# Patient Record
Sex: Female | Born: 1955 | Race: White | Hispanic: No | Marital: Married | Smoking: Former smoker
Health system: Southern US, Community
[De-identification: ages and names within clinical notes are randomized; demographics above are authoritative.]

## PROBLEM LIST (undated history)

## (undated) DIAGNOSIS — Z9889 Other specified postprocedural states: Secondary | ICD-10-CM

## (undated) DIAGNOSIS — K439 Ventral hernia without obstruction or gangrene: Secondary | ICD-10-CM

## (undated) DIAGNOSIS — M1990 Unspecified osteoarthritis, unspecified site: Secondary | ICD-10-CM

## (undated) DIAGNOSIS — E119 Type 2 diabetes mellitus without complications: Secondary | ICD-10-CM

## (undated) DIAGNOSIS — Z87442 Personal history of urinary calculi: Secondary | ICD-10-CM

## (undated) DIAGNOSIS — R112 Nausea with vomiting, unspecified: Secondary | ICD-10-CM

## (undated) DIAGNOSIS — E785 Hyperlipidemia, unspecified: Secondary | ICD-10-CM

## (undated) DIAGNOSIS — I10 Essential (primary) hypertension: Secondary | ICD-10-CM

## (undated) DIAGNOSIS — K219 Gastro-esophageal reflux disease without esophagitis: Secondary | ICD-10-CM

## (undated) DIAGNOSIS — C801 Malignant (primary) neoplasm, unspecified: Secondary | ICD-10-CM

## (undated) DIAGNOSIS — J45909 Unspecified asthma, uncomplicated: Secondary | ICD-10-CM

## (undated) HISTORY — DX: Unspecified osteoarthritis, unspecified site: M19.90

## (undated) HISTORY — DX: Type 2 diabetes mellitus without complications: E11.9

## (undated) HISTORY — PX: BREAST BIOPSY: SHX20

## (undated) HISTORY — DX: Unspecified asthma, uncomplicated: J45.909

## (undated) HISTORY — DX: Hyperlipidemia, unspecified: E78.5

## (undated) HISTORY — PX: CATARACT EXTRACTION: SUR2

## (undated) HISTORY — DX: Gastro-esophageal reflux disease without esophagitis: K21.9

## (undated) HISTORY — DX: Essential (primary) hypertension: I10

---
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
## 1989-04-04 HISTORY — PX: CHOLECYSTECTOMY: SHX55

## 1998-04-04 HISTORY — PX: OTHER SURGICAL HISTORY: SHX169

## 1999-04-05 HISTORY — PX: ABDOMINAL HYSTERECTOMY: SHX81

## 2000-04-04 HISTORY — PX: HERNIA REPAIR: SHX51

## 2012-06-08 ENCOUNTER — Encounter (INDEPENDENT_AMBULATORY_CARE_PROVIDER_SITE_OTHER): Payer: BC Managed Care – PPO | Admitting: Surgery

## 2012-06-08 ENCOUNTER — Telehealth (INDEPENDENT_AMBULATORY_CARE_PROVIDER_SITE_OTHER): Payer: Self-pay | Admitting: General Surgery

## 2012-06-08 NOTE — Telephone Encounter (Signed)
Pt was scheduled from [facility] for urgent office today.  Discussed her situation with Dr. Gerrit Friends and pt was reappointed to Monday urgent office.  She is currently on oral antibiotics (Keflex) and received Rocephin injection at Dr. Bonnita Levan office.  She understands to go to ER if temperature is elevated or pain worsens; she will come to either [facility] or [facility] in that instance.

## 2012-06-11 ENCOUNTER — Encounter (INDEPENDENT_AMBULATORY_CARE_PROVIDER_SITE_OTHER): Payer: Self-pay | Admitting: General Surgery

## 2012-06-11 ENCOUNTER — Ambulatory Visit (INDEPENDENT_AMBULATORY_CARE_PROVIDER_SITE_OTHER): Payer: BC Managed Care – PPO | Admitting: General Surgery

## 2012-06-11 ENCOUNTER — Telehealth (INDEPENDENT_AMBULATORY_CARE_PROVIDER_SITE_OTHER): Payer: Self-pay | Admitting: General Surgery

## 2012-06-11 ENCOUNTER — Other Ambulatory Visit (INDEPENDENT_AMBULATORY_CARE_PROVIDER_SITE_OTHER): Payer: Self-pay | Admitting: General Surgery

## 2012-06-11 VITALS — BP 158/90 | HR 100 | Temp 96.9°F | Ht 63.0 in | Wt 235.0 lb

## 2012-06-11 DIAGNOSIS — L03311 Cellulitis of abdominal wall: Secondary | ICD-10-CM

## 2012-06-11 DIAGNOSIS — L02219 Cutaneous abscess of trunk, unspecified: Secondary | ICD-10-CM

## 2012-06-11 DIAGNOSIS — L03319 Cellulitis of trunk, unspecified: Secondary | ICD-10-CM

## 2012-06-11 DIAGNOSIS — L02211 Cutaneous abscess of abdominal wall: Secondary | ICD-10-CM

## 2012-06-11 NOTE — Progress Notes (Signed)
The patient comes in with a one-week history of abdominal wall cellulitis and an indurated area just to the left and inferior of her umbilicus. She is a non-insulin-dependent diabetic she is morbidly obese but this problem seems to have improved after she received 2 injections of Rocephin. She continues to take Keflex in addition to having gotten injections of Rocephin.  On examination she has an indurated area in the left inferior periumbilical area that measures probably 5-6 cm in size. It is tender to touch. The amount of erythema has decreased significantly according to the patient and her husband.  I cannot palpate any actual fluctuance however there is a good chance that somewhat related to her prior hernia repair which was done with mesh that she might have an infected area and maybe some purulence and it may be infected the mesh  Because of these concerns would like to get a CT scan of the abdomen and pelvis with contrast to see if there is actually fluid collection in that area if so then she will likely need an incision and drainage under anesthesia because of the depth of the problem. We will go ahead and order CT scan of abdomen and pelvis with contrast for the patient to be done likely tomorrow morning in the area where she lives which is in the [place] area of [place].

## 2012-06-11 NOTE — Telephone Encounter (Signed)
Patient called and stated that she is going to [facility] to get her lab work done. She went across the street to [facility] and she stated that she waited an hour, so wanted to let you know that she is going to [facility]

## 2012-06-12 ENCOUNTER — Ambulatory Visit (HOSPITAL_COMMUNITY)
Admission: RE | Admit: 2012-06-12 | Discharge: 2012-06-12 | Disposition: A | Discharge status: Home / Self Care | Payer: BC Managed Care – PPO | Source: Ambulatory Visit | Attending: General Surgery | Admitting: General Surgery

## 2012-06-12 ENCOUNTER — Telehealth (INDEPENDENT_AMBULATORY_CARE_PROVIDER_SITE_OTHER): Payer: Self-pay

## 2012-06-12 DIAGNOSIS — K439 Ventral hernia without obstruction or gangrene: Secondary | ICD-10-CM | POA: Insufficient documentation

## 2012-06-12 DIAGNOSIS — L02219 Cutaneous abscess of trunk, unspecified: Secondary | ICD-10-CM | POA: Insufficient documentation

## 2012-06-12 LAB — COMPREHENSIVE METABOLIC PANEL
Albumin: 3.8 g/dL (ref 3.5–5.2)
Alkaline Phosphatase: 89 U/L (ref 39–117)
BUN: 16 mg/dL (ref 6–23)
Calcium: 10.4 mg/dL (ref 8.4–10.5)
Glucose, Bld: 107 mg/dL — ABNORMAL HIGH (ref 70–99)
Potassium: 5.3 mEq/L (ref 3.5–5.3)

## 2012-06-12 MED ORDER — IOHEXOL 300 MG/ML  SOLN
100.0000 mL | Freq: Once | INTRAMUSCULAR | Status: AC | PRN
  Administered 2012-06-12: 100 mL via INTRAVENOUS

## 2012-06-12 NOTE — Telephone Encounter (Signed)
I called patient to let her know Lindie Spruce is moving her appt up to 3/18 @ 10:10. SHe needs to stay on her current abx.

## 2012-06-12 NOTE — Telephone Encounter (Signed)
Patient called back and stated she may be out of her antibiotic prior to her appt on 06/19/2012.  Spoke to [place] who had me instruct patient to call the day before she runs out of antibiotic to see if Lindie Spruce MD will want to call in prescription.  Patient states understanding and agreeable at this time.

## 2012-06-14 ENCOUNTER — Telehealth (INDEPENDENT_AMBULATORY_CARE_PROVIDER_SITE_OTHER): Payer: Self-pay | Admitting: General Surgery

## 2012-06-14 NOTE — Telephone Encounter (Signed)
Pt called to ask about CT result and new pocket of fluid "right under the skin."  She has appt next Tues to follow up with Dr. Lindie Spruce.  Paged and updated Dr. Lindie Spruce; feels its OK to wait until next week unless pain is worse.  She understands and will take ibuprofen for discomfort.

## 2012-06-19 ENCOUNTER — Ambulatory Visit (INDEPENDENT_AMBULATORY_CARE_PROVIDER_SITE_OTHER): Payer: BC Managed Care – PPO | Admitting: General Surgery

## 2012-06-19 ENCOUNTER — Encounter (INDEPENDENT_AMBULATORY_CARE_PROVIDER_SITE_OTHER): Payer: Self-pay | Admitting: General Surgery

## 2012-06-19 VITALS — BP 132/84 | HR 78 | Temp 98.6°F | Resp 18 | Ht 63.0 in | Wt 234.0 lb

## 2012-06-19 DIAGNOSIS — K439 Ventral hernia without obstruction or gangrene: Secondary | ICD-10-CM

## 2012-06-19 MED ORDER — AMOXICILLIN-POT CLAVULANATE 875-125 MG PO TABS: 1.0000 | ORAL_TABLET | Freq: Two times a day (BID) | ORAL | Status: AC

## 2012-06-19 MED ORDER — TRAMADOL HCL 50 MG PO TABS: 50.0000 mg | ORAL_TABLET | Freq: Four times a day (QID) | ORAL | Status: DC | PRN

## 2012-06-19 NOTE — Progress Notes (Signed)
The patient comes in today with continued discomfort in her periumbilical area. She has significant induration and fullness in that area. After Betadine prep and local anesthesia was able to aspirate approximately 5 cc of bloody pus from her periumbilical area. No enteric contents were noted. The patient did have some moderate discomfort afterwards.  In addition to this fluid collection the patient has a large ventral hernia. Prior to doing anything about her hernia she needs to have this infection control. This does require an incision and drainage under general anesthesia in the operating room.  In the future hernia repair it is possible however because of her weight it will be a significant challenge. We will go ahead and schedule her for an incision and drainage. Prior to doing so I will place the patient on Augmentin therapy today twice a day. Her wound drainage aspiration will be sent for culture.

## 2012-06-19 NOTE — Addendum Note (Signed)
Addended by: Frederik Schmidt on: 06/19/2012 11:11 AM   Modules accepted: Orders

## 2012-06-20 ENCOUNTER — Encounter (HOSPITAL_COMMUNITY): Payer: Self-pay | Admitting: Pharmacy Technician

## 2012-06-22 LAB — WOUND CULTURE: Organism ID, Bacteria: NO GROWTH

## 2012-06-26 ENCOUNTER — Encounter (INDEPENDENT_AMBULATORY_CARE_PROVIDER_SITE_OTHER): Payer: BC Managed Care – PPO | Admitting: General Surgery

## 2012-06-26 NOTE — Pre-Procedure Instructions (Signed)
Leomia Stahle  06/26/2012   Your procedure is scheduled on:  Monday, March 31st  Report to [facility] at 0800 AM.  Call this number if you have problems the morning of surgery: [phone]   Remember:   Do not eat food or drink liquids after midnight.    Take these medicines the morning of surgery with A SIP OF WATER: Augmentin, zyrtec, toprol, prilosec, ultram if needed   Do not wear jewelry, make-up or nail polish.  Do not wear lotions, powders, or perfumes,deodorant.  Do not shave 48 hours prior to surgery.  Do not bring valuables to the hospital.  Contacts, dentures or bridgework may not be worn into surgery.  Leave suitcase in the car. After surgery it may be brought to your room.  For patients admitted to the hospital, checkout time is 11:00 AM the day of discharge.   Patients discharged the day of surgery will not be allowed to drive home.    Special Instructions: Shower using CHG 2 nights before surgery and the night before surgery.  If you shower the day of surgery use CHG.  Use special wash - you have one bottle of CHG for all showers.  You should use approximately 1/3 of the bottle for each shower.   Please read over the following fact sheets that you were given: Pain Booklet, Coughing and Deep Breathing, MRSA Information and Surgical Site Infection Prevention

## 2012-06-27 ENCOUNTER — Encounter (HOSPITAL_COMMUNITY)
Admission: RE | Admit: 2012-06-27 | Discharge: 2012-06-27 | Disposition: A | Discharge status: Home / Self Care | Payer: BC Managed Care – PPO | Source: Ambulatory Visit | Attending: General Surgery | Admitting: General Surgery

## 2012-06-27 ENCOUNTER — Encounter (HOSPITAL_COMMUNITY): Payer: Self-pay

## 2012-06-27 HISTORY — DX: Nausea with vomiting, unspecified: R11.2

## 2012-06-27 HISTORY — DX: Nausea with vomiting, unspecified: Z98.890

## 2012-06-27 LAB — CBC WITH DIFFERENTIAL/PLATELET
Hemoglobin: 13.2 g/dL (ref 12.0–15.0)
Lymphocytes Relative: 31 % (ref 12–46)
Lymphs Abs: 3.3 10*3/uL (ref 0.7–4.0)
MCH: 30.6 pg (ref 26.0–34.0)
MCV: 91.4 fL (ref 78.0–100.0)
Monocytes Relative: 5 % (ref 3–12)
Neutrophils Relative %: 60 % (ref 43–77)
Platelets: 363 10*3/uL (ref 150–400)
RBC: 4.31 MIL/uL (ref 3.87–5.11)
WBC: 10.8 10*3/uL — ABNORMAL HIGH (ref 4.0–10.5)

## 2012-06-27 LAB — BASIC METABOLIC PANEL
CO2: 27 mEq/L (ref 19–32)
Calcium: 9.9 mg/dL (ref 8.4–10.5)
GFR calc non Af Amer: >90 mL/min (ref >90)
Glucose, Bld: 183 mg/dL — ABNORMAL HIGH (ref 70–99)
Potassium: 4.4 mEq/L (ref 3.5–5.1)
Sodium: 140 mEq/L (ref 135–145)

## 2012-06-27 LAB — SURGICAL PCR SCREEN
MRSA, PCR: NEGATIVE
Staphylococcus aureus: NEGATIVE

## 2012-06-27 NOTE — Progress Notes (Addendum)
Patient is at risk for COSA; Questionnaire faxed to PCP, Dr Sherril Croon in [place], [place]

## 2012-07-01 NOTE — H&P (Signed)
Leslie [name] is an 57 y.o. female.   Chief Complaint: Abdominal pain and recurrent hernia  HPI: The patient was initially referred from [facility] with a periumbilical erythematous area with induration on March 10th.  She had received an IM dose of Rocephin and had been placed on oral antibiotics.  When I saw her in my office I sent her for a CT scan which demonstrated that the patient had a recurrent ventral hernia and a peri-prosthetic fluid collection suspicious for an abscess.  On her next office visit approximately 5 cc of purulent bloody fluid was aspirated from the periumbilical area which failed to grow out any bacteria.  She continues to have a build up of fluid around the site of her previous hernia repair.  She is being brought in for surgical drainage.  Repair of her hernia is not planned.  Past Medical History  Diagnosis Date  . Hyperlipidemia   . Asthma   . GERD (gastroesophageal reflux disease)   . Arthritis   . PONV (postoperative nausea and vomiting)   . Hypertension     on meds for 2 years  . Diabetes mellitus without complication     Type 2 NIDDM x 3 years    Past Surgical History  Procedure Laterality Date  . Hernia repair  2002  . Cholecystectomy  1991  . Tonsillectomy  1967  . Abdominal hysterectomy  2001  . Kidney stones  2000    Family History  Problem Relation Age of Onset  . Hypertension Mother   . Alzheimer's disease Mother    Social History:  reports that she has quit smoking. Her smoking use included Cigarettes. She has a 4 pack-year smoking history. She does not have any smokeless tobacco history on file. She reports that  drinks alcohol. She reports that she does not use illicit drugs.  Allergies:  Allergies  Allergen Reactions  . Tape     BAND AIDS  . Hydrochlorothiazide Other (See Comments)    High sugars  . Lisinopril Cough  . Morphine And Related Nausea And Vomiting    No prescriptions prior to admission    No results found for this or  any previous visit (from the past 48 hour(s)). No results found.  ROS  There were no vitals taken for this visit. Physical Exam  Constitutional: She is oriented to person, place, and time. She appears well-developed and well-nourished.  HENT:  Head: Normocephalic and atraumatic.  Cardiovascular: Normal rate and regular rhythm.   Respiratory: Effort normal and breath sounds normal.  GI: Soft. Bowel sounds are normal.    Musculoskeletal: Normal range of motion.  Neurological: She is alert and oriented to person, place, and time. She has normal reflexes.  Skin: Skin is warm and dry.  Psychiatric: She has a normal mood and affect. Her behavior is normal. Judgment and thought content normal.     Assessment/Plan Obese diabetic patient with recurrent hernia and peri-prosthetic fluid collection with possible infection.  The current infection has to be cleared prior to surgical consideration of repair, at least with synthetic material..  She is being taken to surgery for drainage and washout.  Cherylynn Ridges 07/01/2012, 10:01 PM Patient has no further questions.  Not likely to repair hernia today.  Marta Lamas. Gae Bon, MD, FACS [phone] [facility]

## 2012-07-02 ENCOUNTER — Ambulatory Visit (HOSPITAL_COMMUNITY): Payer: BC Managed Care – PPO | Admitting: Anesthesiology

## 2012-07-02 ENCOUNTER — Ambulatory Visit (HOSPITAL_COMMUNITY)
Admission: RE | Admit: 2012-07-02 | Discharge: 2012-07-02 | Disposition: A | Discharge status: Home / Self Care | Payer: BC Managed Care – PPO | Source: Ambulatory Visit | Attending: General Surgery | Admitting: General Surgery

## 2012-07-02 ENCOUNTER — Other Ambulatory Visit (INDEPENDENT_AMBULATORY_CARE_PROVIDER_SITE_OTHER): Payer: Self-pay

## 2012-07-02 ENCOUNTER — Encounter (HOSPITAL_COMMUNITY): Payer: Self-pay | Admitting: *Deleted

## 2012-07-02 ENCOUNTER — Encounter (HOSPITAL_COMMUNITY): Payer: Self-pay | Admitting: Anesthesiology

## 2012-07-02 ENCOUNTER — Ambulatory Visit (HOSPITAL_COMMUNITY): Payer: BC Managed Care – PPO

## 2012-07-02 ENCOUNTER — Encounter (HOSPITAL_COMMUNITY)
Admission: RE | Disposition: A | Discharge status: Home / Self Care | Payer: Self-pay | Source: Ambulatory Visit | Attending: General Surgery

## 2012-07-02 DIAGNOSIS — Z01810 Encounter for preprocedural cardiovascular examination: Secondary | ICD-10-CM | POA: Insufficient documentation

## 2012-07-02 DIAGNOSIS — Z01812 Encounter for preprocedural laboratory examination: Secondary | ICD-10-CM | POA: Insufficient documentation

## 2012-07-02 DIAGNOSIS — K432 Incisional hernia without obstruction or gangrene: Secondary | ICD-10-CM | POA: Insufficient documentation

## 2012-07-02 DIAGNOSIS — L03319 Cellulitis of trunk, unspecified: Secondary | ICD-10-CM

## 2012-07-02 DIAGNOSIS — L02211 Cutaneous abscess of abdominal wall: Secondary | ICD-10-CM

## 2012-07-02 DIAGNOSIS — I10 Essential (primary) hypertension: Secondary | ICD-10-CM | POA: Insufficient documentation

## 2012-07-02 DIAGNOSIS — Z01818 Encounter for other preprocedural examination: Secondary | ICD-10-CM | POA: Insufficient documentation

## 2012-07-02 DIAGNOSIS — L02219 Cutaneous abscess of trunk, unspecified: Secondary | ICD-10-CM

## 2012-07-02 DIAGNOSIS — E119 Type 2 diabetes mellitus without complications: Secondary | ICD-10-CM | POA: Insufficient documentation

## 2012-07-02 HISTORY — PX: INCISION AND DRAINAGE ABSCESS: SHX5864

## 2012-07-02 LAB — GLUCOSE, CAPILLARY: Glucose-Capillary: 119 mg/dL — ABNORMAL HIGH (ref 70–99)

## 2012-07-02 SURGERY — INCISION AND DRAINAGE, ABSCESS
Anesthesia: General | Site: Abdomen | Wound class: Dirty or Infected

## 2012-07-02 MED ORDER — NEOSTIGMINE METHYLSULFATE 1 MG/ML IJ SOLN
INTRAMUSCULAR | Status: DC | PRN
  Administered 2012-07-02: 5 mg via INTRAVENOUS

## 2012-07-02 MED ORDER — MIDAZOLAM HCL 2 MG/2ML IJ SOLN: 2.0000 mg | Freq: Once | INTRAMUSCULAR | Status: DC

## 2012-07-02 MED ORDER — LACTATED RINGERS IV SOLN
INTRAVENOUS | Status: DC | PRN
  Administered 2012-07-02: 10:00:00 via INTRAVENOUS

## 2012-07-02 MED ORDER — PROPOFOL 10 MG/ML IV BOLUS
INTRAVENOUS | Status: DC | PRN
  Administered 2012-07-02: 160 mg via INTRAVENOUS

## 2012-07-02 MED ORDER — LIDOCAINE HCL (CARDIAC) 20 MG/ML IV SOLN
INTRAVENOUS | Status: DC | PRN
  Administered 2012-07-02: 80 mg via INTRAVENOUS

## 2012-07-02 MED ORDER — 0.9 % SODIUM CHLORIDE (POUR BTL) OPTIME
TOPICAL | Status: DC | PRN
  Administered 2012-07-02: 1000 mL

## 2012-07-02 MED ORDER — ROCURONIUM BROMIDE 100 MG/10ML IV SOLN
INTRAVENOUS | Status: DC | PRN
  Administered 2012-07-02: 40 mg via INTRAVENOUS

## 2012-07-02 MED ORDER — HYDROCODONE-ACETAMINOPHEN 5-500 MG PO TABS: 1.0000 | ORAL_TABLET | Freq: Four times a day (QID) | ORAL | Status: DC | PRN

## 2012-07-02 MED ORDER — DEXTROSE 5 % IV SOLN
3.0000 g | INTRAVENOUS | Status: AC
  Administered 2012-07-02: 3 g via INTRAVENOUS
  Filled 2012-07-02: qty 3000

## 2012-07-02 MED ORDER — ALBUTEROL SULFATE HFA 108 (90 BASE) MCG/ACT IN AERS
INHALATION_SPRAY | RESPIRATORY_TRACT | Status: DC | PRN
  Administered 2012-07-02: 6 via RESPIRATORY_TRACT

## 2012-07-02 MED ORDER — GLYCOPYRROLATE 0.2 MG/ML IJ SOLN
INTRAMUSCULAR | Status: DC | PRN
  Administered 2012-07-02: .8 mg via INTRAVENOUS

## 2012-07-02 MED ORDER — FENTANYL CITRATE 0.05 MG/ML IJ SOLN
INTRAMUSCULAR | Status: DC | PRN
  Administered 2012-07-02: 100 ug via INTRAVENOUS
  Administered 2012-07-02: 50 ug via INTRAVENOUS

## 2012-07-02 MED ORDER — METOCLOPRAMIDE HCL 5 MG/ML IJ SOLN
10.0000 mg | Freq: Once | INTRAMUSCULAR | Status: AC
  Administered 2012-07-02: 10 mg via INTRAVENOUS

## 2012-07-02 MED ORDER — FENTANYL CITRATE 0.05 MG/ML IJ SOLN
25.0000 ug | INTRAMUSCULAR | Status: DC | PRN
  Administered 2012-07-02 (×2): 50 ug via INTRAVENOUS

## 2012-07-02 MED ORDER — MIDAZOLAM HCL 2 MG/2ML IJ SOLN
INTRAMUSCULAR | Status: AC
  Administered 2012-07-02: 2 mg
  Filled 2012-07-02: qty 2

## 2012-07-02 MED ORDER — ONDANSETRON HCL 4 MG/2ML IJ SOLN
INTRAMUSCULAR | Status: DC | PRN
  Administered 2012-07-02: 4 mg via INTRAVENOUS

## 2012-07-02 MED ORDER — FENTANYL CITRATE 0.05 MG/ML IJ SOLN
INTRAMUSCULAR | Status: AC
  Filled 2012-07-02: qty 2

## 2012-07-02 MED ORDER — LACTATED RINGERS IV SOLN: INTRAVENOUS | Status: DC

## 2012-07-02 SURGICAL SUPPLY — 26 items
BANDAGE GAUZE ELAST BULKY 4 IN (GAUZE/BANDAGES/DRESSINGS) IMPLANT
CANISTER SUCTION 2500CC (MISCELLANEOUS) ×2 IMPLANT
CLOTH BEACON ORANGE TIMEOUT ST (SAFETY) ×2 IMPLANT
COVER SURGICAL LIGHT HANDLE (MISCELLANEOUS) ×2 IMPLANT
DRAPE LAPAROSCOPIC ABDOMINAL (DRAPES) ×2 IMPLANT
DRAPE UTILITY 15X26 W/TAPE STR (DRAPE) ×4 IMPLANT
DRSG PAD ABDOMINAL 8X10 ST (GAUZE/BANDAGES/DRESSINGS) IMPLANT
ELECT CAUTERY BLADE 6.4 (BLADE) ×2 IMPLANT
ELECT REM PT RETURN 9FT ADLT (ELECTROSURGICAL) ×2
ELECTRODE REM PT RTRN 9FT ADLT (ELECTROSURGICAL) ×1 IMPLANT
GAUZE PACKING IODOFORM 1/2 (PACKING) ×2 IMPLANT
GLOVE BIOGEL PI IND STRL 7.0 (GLOVE) ×2 IMPLANT
GLOVE BIOGEL PI INDICATOR 7.0 (GLOVE) ×2
GLOVE SURG SS PI 7.0 STRL IVOR (GLOVE) ×4 IMPLANT
GOWN STRL NON-REIN LRG LVL3 (GOWN DISPOSABLE) ×4 IMPLANT
KIT BASIN OR (CUSTOM PROCEDURE TRAY) ×2 IMPLANT
KIT ROOM TURNOVER OR (KITS) ×2 IMPLANT
NS IRRIG 1000ML POUR BTL (IV SOLUTION) ×2 IMPLANT
PACK GENERAL/GYN (CUSTOM PROCEDURE TRAY) ×2 IMPLANT
PAD ARMBOARD 7.5X6 YLW CONV (MISCELLANEOUS) ×2 IMPLANT
SPONGE GAUZE 4X4 12PLY (GAUZE/BANDAGES/DRESSINGS) IMPLANT
SWAB COLLECTION DEVICE MRSA (MISCELLANEOUS) ×2 IMPLANT
TAPE CLOTH SURG 6X10 WHT LF (GAUZE/BANDAGES/DRESSINGS) ×2 IMPLANT
TOWEL OR 17X24 6PK STRL BLUE (TOWEL DISPOSABLE) ×2 IMPLANT
TOWEL OR 17X26 10 PK STRL BLUE (TOWEL DISPOSABLE) ×2 IMPLANT
TUBE ANAEROBIC SPECIMEN COL (MISCELLANEOUS) ×2 IMPLANT

## 2012-07-02 NOTE — Op Note (Signed)
OPERATIVE REPORT  DATE OF OPERATION: 07/02/2012  PATIENT:  Leslie [name]  57 y.o. female  PRE-OPERATIVE DIAGNOSIS:  Central abdominal wall abscess  POST-OPERATIVE DIAGNOSIS:  Central abdominal wall abscess  PROCEDURE:  Procedure(s): INCISION AND DRAINAGE ABSCESS of abdominal wall abscess  SURGEON:  Surgeon(s): Cherylynn Ridges, MD  ASSISTANT: None  ANESTHESIA:   general  EBL: <20 ml  BLOOD ADMINISTERED: none  DRAINS: Wound packed with entire bottle of 1/2 inch Iodoform NuGauze   SPECIMEN:  Source of Specimen:  Abdominal wall abscess for aerobic and anaerobic cultures  COUNTS CORRECT:  YES  PROCEDURE DETAILS: The patient was taken to the operating room and placed on the table in the supine position. After an adequate general endotracheal anesthetic was administered she was prepped and draped in the usual sterile manner exposing her entire abdomen.  After proper time out was performed identifying the patient and the procedure to be performed an upper midline incision just to the left of the umbilicus was made using a #10 blade and taken down to subcutaneous tissue. In the deep portions just to the left of the umbilicus in the area of most induration there was a small pocket of cloudy fluid. Those mostly and evacuated area with what palpated to be a possible hernia sac. There was no protruding intestines. This 3 cm incision was used to pack the entire bottle of half-inch iodoform Nu Gauze into the pocket with the patient had previously had an infection. It was covered with a sterile dressing.  All counts were correct.  PATIENT DISPOSITION:  PACU - hemodynamically stable.   Cherylynn Ridges 3/31/20141:41 PM

## 2012-07-02 NOTE — Preoperative (Signed)
2Beta Blockers   Reason not to administer Beta Blockers:Metorprolol 07/02/12 0530

## 2012-07-02 NOTE — Progress Notes (Signed)
Dr.Edwards at bedside called chest xray to dr. Randa Evens shows possible pneumonia or aspiration

## 2012-07-02 NOTE — Anesthesia Procedure Notes (Addendum)
Performed by: Leona Singleton A   Date/Time: 07/02/2012 1:18 PM Performed by: Leona Singleton A Pre-anesthesia Checklist: Patient identified Patient Re-evaluated:Patient Re-evaluated prior to inductionOxygen Delivery Method: Circle system utilized Preoxygenation: Pre-oxygenation with 100% oxygen Intubation Type: IV induction Ventilation: Mask ventilation without difficulty Laryngoscope Size: Miller and 2 Grade View: Grade I Tube type: Oral Tube size: 7.5 mm Number of attempts: 1 Airway Equipment and Method: Stylet and Oral airway Placement Confirmation: ETT inserted through vocal cords under direct vision,  positive ETCO2 and breath sounds checked- equal and bilateral Secured at: 22 cm Tube secured with: Tape Dental Injury: Teeth and Oropharynx as per pre-operative assessment

## 2012-07-02 NOTE — Interval H&P Note (Signed)
History and Physical Interval Note:  07/02/2012 10:25 AM  Leslie [name]  has presented today for surgery, with the diagnosis of Central abdominal wall abscess  The various methods of treatment have been discussed with the patient and family. After consideration of risks, benefits and other options for treatment, the patient has consented to  Procedure(s): INCISION AND DRAINAGE ABSCESS of abdominal wall abscess (N/A) as a surgical intervention .  The patient's history has been reviewed, patient examined, no change in status, stable for surgery.  I have reviewed the patient's chart and labs.  Questions were answered to the patient's satisfaction.     Ishaan Villamar, Marta Lamas

## 2012-07-02 NOTE — Anesthesia Preprocedure Evaluation (Addendum)
Anesthesia Evaluation  Patient identified by MRN, date of birth, ID band Patient awake    Reviewed: Allergy & Precautions, H&P , NPO status , Patient's Chart, lab work & pertinent test results, reviewed documented beta blocker date and time   History of Anesthesia Complications (+) PONV  Airway Mallampati: II TM Distance: >3 FB Neck ROM: Full    Dental  (+) Teeth Intact and Dental Advisory Given   Pulmonary asthma , pneumonia -,  breath sounds clear to auscultation        Cardiovascular hypertension, Pt. on medications and Pt. on home beta blockers Rhythm:Regular Rate:Normal     Neuro/Psych negative neurological ROS  negative psych ROS   GI/Hepatic Neg liver ROS, GERD-  Medicated and Controlled,  Endo/Other  diabetes, Type 2, Oral Hypoglycemic Agents  Renal/GU negative Renal ROS     Musculoskeletal  (+) Arthritis -, Osteoarthritis,    Abdominal (+) + obese,   Peds negative pediatric ROS (+)  Hematology negative hematology ROS (+)   Anesthesia Other Findings   Reproductive/Obstetrics                          Anesthesia Physical Anesthesia Plan  ASA: III  Anesthesia Plan: General   Post-op Pain Management:    Induction: Intravenous  Airway Management Planned: Oral ETT  Additional Equipment:   Intra-op Plan:   Post-operative Plan: Extubation in OR  Informed Consent: I have reviewed the patients History and Physical, chart, labs and discussed the procedure including the risks, benefits and alternatives for the proposed anesthesia with the patient or authorized representative who has indicated his/her understanding and acceptance.     Plan Discussed with: CRNA, Anesthesiologist and Surgeon  Anesthesia Plan Comments:         Anesthesia Quick Evaluation

## 2012-07-02 NOTE — Progress Notes (Signed)
This note also relates to the following rows which could not be included: Pulse Rate - Cannot attach notes to unvalidated device data   

## 2012-07-02 NOTE — Progress Notes (Signed)
Dr. Lindie Spruce spoke with patient at bedside regaln ordered for nausea

## 2012-07-02 NOTE — Transfer of Care (Signed)
Immediate Anesthesia Transfer of Care Note  Patient: Leslie [name]  Procedure(s) Performed: Procedure(s): INCISION AND DRAINAGE ABSCESS of abdominal wall abscess (N/A)  Patient Location: PACU  Anesthesia Type:General  Level of Consciousness: awake, oriented and patient cooperative  Airway & Oxygen Therapy: Patient Spontanous Breathing and Patient connected to nasal cannula oxygen  Post-op Assessment: Report given to PACU RN, Post -op Vital signs reviewed and stable and Patient moving all extremities X 4  Post vital signs: Reviewed and stable  Complications: No apparent anesthesia complications

## 2012-07-02 NOTE — Progress Notes (Signed)
Pt assisted to wheel chair moves very well nausea gone pain 3.5 per patient

## 2012-07-03 ENCOUNTER — Encounter (HOSPITAL_COMMUNITY): Payer: Self-pay | Admitting: General Surgery

## 2012-07-04 ENCOUNTER — Encounter (INDEPENDENT_AMBULATORY_CARE_PROVIDER_SITE_OTHER): Payer: Self-pay

## 2012-07-04 ENCOUNTER — Telehealth (INDEPENDENT_AMBULATORY_CARE_PROVIDER_SITE_OTHER): Payer: Self-pay | Admitting: General Surgery

## 2012-07-04 ENCOUNTER — Ambulatory Visit (INDEPENDENT_AMBULATORY_CARE_PROVIDER_SITE_OTHER): Payer: BC Managed Care – PPO

## 2012-07-04 VITALS — BP 136/84 | HR 88 | Temp 98.0°F | Resp 18 | Ht 63.0 in | Wt 235.0 lb

## 2012-07-04 DIAGNOSIS — Z5189 Encounter for other specified aftercare: Secondary | ICD-10-CM

## 2012-07-04 NOTE — Progress Notes (Signed)
Patient in today for Nurse only . Abdominal dressing and packing  removed ; Cleansed area with Normal saline; Applied sterile Gloves ; Packed abd wound by sterile cotton tipped applicator with  Iodoform 1/2inch; Wet To Dry dressing applied; secured with paper tape. Patient tolerated well; advised patient to call if fever 100.9 , redness at wound site, unusual drainage . Patient verbalized understanding. Education on Wound dressing changes QD provided to patient and daughter which is a Theatre stage manager. Patient is to see Dr. Lindie Spruce 07/16/12 @2 :30. Darl Pikes is attempting to find A Home care agency to provide wound dressing changes in her area

## 2012-07-04 NOTE — Telephone Encounter (Signed)
Spoke with this patient she will be coming in for a nurse only visit . I am unable to find a [facility] agency that will take [employer] PPO or one that has coverage for this area [place]

## 2012-07-05 LAB — CULTURE, ROUTINE-ABSCESS

## 2012-07-05 NOTE — Anesthesia Postprocedure Evaluation (Signed)
  Anesthesia Post-op Note  Patient: Engineer, structural  Procedure(s) Performed: Procedure(s): INCISION AND DRAINAGE ABDOMINAL WALL ABSCESS (N/A)  Patient Location: PACU  Anesthesia Type:General  Level of Consciousness: awake  Airway and Oxygen Therapy: Patient Spontanous Breathing  Post-op Pain: mild  Post-op Assessment: Post-op Vital signs reviewed  Post-op Vital Signs: Reviewed  Complications: No apparent anesthesia complications

## 2012-07-06 ENCOUNTER — Ambulatory Visit (INDEPENDENT_AMBULATORY_CARE_PROVIDER_SITE_OTHER): Payer: BC Managed Care – PPO

## 2012-07-06 ENCOUNTER — Encounter (INDEPENDENT_AMBULATORY_CARE_PROVIDER_SITE_OTHER): Payer: Self-pay

## 2012-07-06 VITALS — BP 132/82 | HR 82 | Temp 99.0°F | Resp 18 | Ht 60.0 in | Wt 233.0 lb

## 2012-07-06 DIAGNOSIS — Z5189 Encounter for other specified aftercare: Secondary | ICD-10-CM

## 2012-07-06 NOTE — Progress Notes (Signed)
Patient in today for dressing abdomen dressing change;Rmeoved dressing and iodoform packing .  Abdomen no drainage,redness or odor  Noted; Wound clean . Cleansed area with normal  Saline ; Packed abdominal wound with 1/2 inch Iodoform dressing ;applied wet to dry dressing ;applied  Pressure dressing ABD . Secured with paper tape. Patient tolerated well . Advised to follow up as ordered  For nurse only next week for dressing change and to call if if changes in wound such as odor, serous drainage,redness around site. Or fever 100. + if MD unavailable go to the ER. Patient verbalized understanding.

## 2012-07-07 LAB — ANAEROBIC CULTURE

## 2012-07-10 ENCOUNTER — Ambulatory Visit (INDEPENDENT_AMBULATORY_CARE_PROVIDER_SITE_OTHER): Payer: BC Managed Care – PPO

## 2012-07-10 VITALS — BP 136/74 | HR 96 | Temp 98.0°F | Resp 18 | Ht 60.0 in | Wt 233.0 lb

## 2012-07-10 DIAGNOSIS — Z5189 Encounter for other specified aftercare: Secondary | ICD-10-CM

## 2012-07-10 NOTE — Progress Notes (Signed)
Patient arrived for abdominal wound  check and dressing  Change. Removed dressing/iodoform packing;  Central abdominal wound no redness,no drainage, no odor noted;  Cleansed area with normal saline; Applied 1/4 iodoform packing to wound; applied wet to dry dressing, ABD pad ; Secured with Paper tape; Patient tolerated well . Patients daughter has been changing dressing QD. Advised patient to call if any changes in condition or go to ER. Patient verbalized understanding. Pt has appointment  with DR. Wyatt next week.

## 2012-07-16 ENCOUNTER — Ambulatory Visit (INDEPENDENT_AMBULATORY_CARE_PROVIDER_SITE_OTHER): Payer: BC Managed Care – PPO | Admitting: General Surgery

## 2012-07-16 ENCOUNTER — Encounter (INDEPENDENT_AMBULATORY_CARE_PROVIDER_SITE_OTHER): Payer: Self-pay | Admitting: General Surgery

## 2012-07-16 VITALS — BP 140/88 | HR 84 | Temp 97.2°F | Resp 16 | Ht 63.0 in | Wt 233.0 lb

## 2012-07-16 DIAGNOSIS — Z09 Encounter for follow-up examination after completed treatment for conditions other than malignant neoplasm: Secondary | ICD-10-CM

## 2012-07-16 NOTE — Progress Notes (Signed)
The patient is doing well. She states that overall she is doing much better. She is eating well and has had no fevers or chills.  A wound is being taken care of by her daughter. It is being packed once a day. This is adequate. They're using half-inch plain gauze. However they've been packing it with dry gauze and putting a wet gauze on top.  I removed the dressing that was currently in the wound cleansed with peroxide. I then probed deeply to make sure there was no retained gauze or foreign material. None was noted. I subsequently packed with approximately 1 foot of quarter-inch plain gauze soaked in saline. A sterile 4 x 4 was placed on top.  I will see the patient back in 3 weeks. She should continue to do wet-to-dry dressings with wet portion being the packing inside. She does not need to come back to the office for weekly visits. I will see her in 3 weeks to reassess her wound. At this time her hernia does not appear to be causing her any problems.

## 2012-08-07 ENCOUNTER — Encounter (INDEPENDENT_AMBULATORY_CARE_PROVIDER_SITE_OTHER): Payer: Self-pay | Admitting: General Surgery

## 2012-08-07 ENCOUNTER — Ambulatory Visit (INDEPENDENT_AMBULATORY_CARE_PROVIDER_SITE_OTHER): Payer: BC Managed Care – PPO | Admitting: General Surgery

## 2012-08-07 VITALS — BP 132/80 | HR 88 | Resp 16 | Ht 63.0 in | Wt 232.4 lb

## 2012-08-07 DIAGNOSIS — K439 Ventral hernia without obstruction or gangrene: Secondary | ICD-10-CM

## 2012-08-07 NOTE — Progress Notes (Signed)
The patient comes in today feeling very well. She states that her daughter who is doing her dressing changes can only get a small amount of opaque in her wound.  On examination today her wound only probes a couple of millimeters and then stops. I cleansed it with peroxide and there was no deep fascial pocket.  The patient is doing very well and I told her to cover the wound with triple antibiotic ointment once a day and cover with a sterile dressing. She can shower and had her wound dry also.  We addressed the fact that the patient does still have her ventral hernia and that may need to be repaired in the future. Currently she is asymptomatic. If we were to undertake repairing her hernia a laparoscopic approach would likely be the best. All of the patient again in one month to discuss this however she wanted to wait at least a year before considering having surgery because of having little time off from work.  I'll see her again in one month. At that time we discussed in more detail the laparoscopic ventral hernia repair approach.

## 2012-08-10 ENCOUNTER — Encounter (INDEPENDENT_AMBULATORY_CARE_PROVIDER_SITE_OTHER): Payer: BC Managed Care – PPO | Admitting: General Surgery

## 2012-09-04 ENCOUNTER — Ambulatory Visit (INDEPENDENT_AMBULATORY_CARE_PROVIDER_SITE_OTHER): Payer: BC Managed Care – PPO | Admitting: General Surgery

## 2012-09-04 ENCOUNTER — Encounter (INDEPENDENT_AMBULATORY_CARE_PROVIDER_SITE_OTHER): Payer: Self-pay | Admitting: General Surgery

## 2012-09-04 VITALS — BP 132/80 | HR 78 | Temp 97.0°F | Resp 18 | Ht 63.0 in | Wt 235.0 lb

## 2012-09-04 DIAGNOSIS — K439 Ventral hernia without obstruction or gangrene: Secondary | ICD-10-CM

## 2012-09-04 NOTE — Progress Notes (Signed)
The patient comes in today asymptomatic. Her wound has completely healed. There is no infection. She has no tenderness on examination. She continues to have a hernia that is mostly to the left of her midline incision. However she does not have any symptoms and does not want to have any surgical repair at this time.  The patient she'll require surgery in the future a laparoscopic approach could be considered however because of her size it may require an open procedure also. At the current time because of the lack of symptoms no surgery will be scheduled. She can return to see me on an as-needed basis.

## 2013-01-22 ENCOUNTER — Ambulatory Visit (INDEPENDENT_AMBULATORY_CARE_PROVIDER_SITE_OTHER): Payer: BC Managed Care – PPO | Admitting: General Surgery

## 2013-01-22 ENCOUNTER — Encounter (INDEPENDENT_AMBULATORY_CARE_PROVIDER_SITE_OTHER): Payer: Self-pay | Admitting: General Surgery

## 2013-01-22 VITALS — BP 140/90 | HR 88 | Temp 97.1°F | Resp 14 | Ht 63.0 in | Wt 235.8 lb

## 2013-01-22 DIAGNOSIS — T148XXA Other injury of unspecified body region, initial encounter: Secondary | ICD-10-CM | POA: Insufficient documentation

## 2013-01-22 NOTE — Progress Notes (Signed)
The patient comes in today complaining of drainage from the upper portion of her midline incision. On Sunday she had a dark in area almost black but subsequently spontaneously opened and started draining clearish yellow fluid.  On examination today she has about a 1 cm opening in the upper midportion of her wound. Upon probing this area with a Q-tip soaked in peroxide there is a very small cavity but nothing that needed to be packed.  I covered the wound with triple antibiotic ointment and subsequently a 4 x 4 gauze. The patient can subsequently take care of this wound was soaked in water, packing the wound dry, and applying triple antibiotic ointment on a daily basis. She's been advised to try to keep it covered at all times while it is draining. Once the drainage has completed she should continue to cover it for several days to make sure that nothing else will start to spontaneously open. She is to return to see me on an as-needed basis.

## 2013-02-07 ENCOUNTER — Other Ambulatory Visit: Payer: Self-pay

## 2014-10-25 IMAGING — CT CT ABD-PELV W/ CM
2 of 4 series · 16 of 46 positions shown, 18 images · IV contrast (Omnipaque 300)
Comparison: No priors.

CLINICAL DATA: Abdominal wall abscess.  Cellulitis.  Abdominal
pain.

CT ABDOMEN AND PELVIS WITH CONTRAST
TECHNIQUE: Multidetector CT imaging of the abdomen and pelvis was
performed following the standard protocol during bolus
administration of intravenous contrast.
Contrast: 100mL OMNIPAQUE IOHEXOL 300 MG/ML  SOLN

[Series 2: abd_pel_with 5.0 b40f · axial · 0.69mm/px · z∈[-513,-118]mm · 13 of 89 slices shown, 15 images]
[im 5/89  soft-tissue]
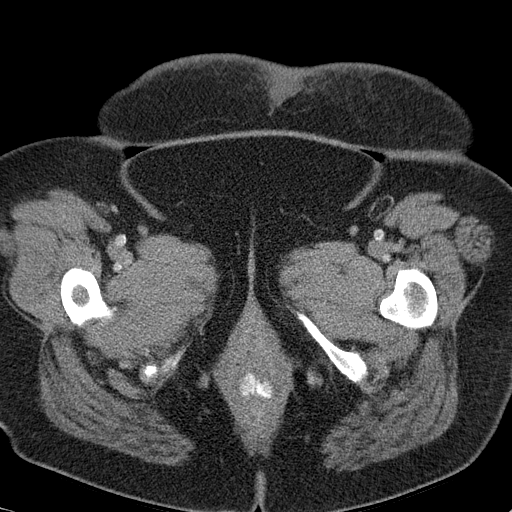
[im 5/89  bone]
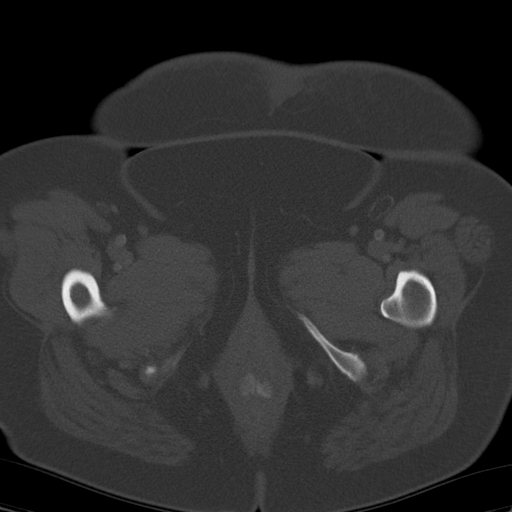
[im 13/89  soft-tissue]
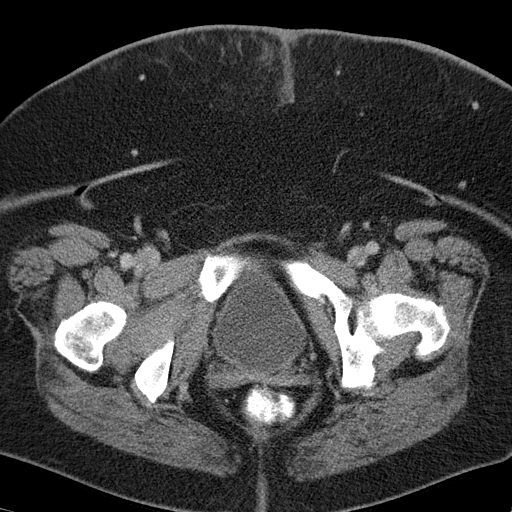
[im 17/89  soft-tissue]
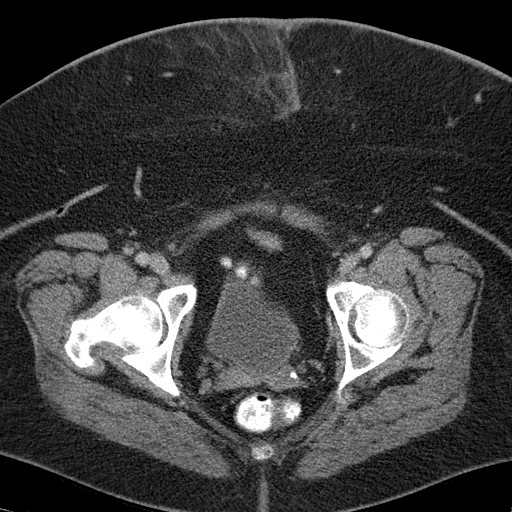
[im 26/89  soft-tissue]
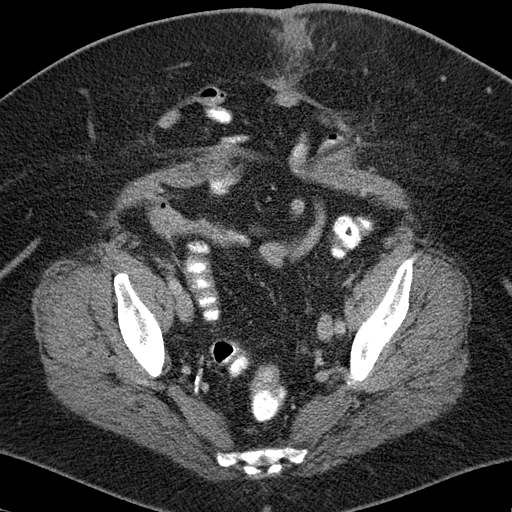
[im 30/89  soft-tissue]
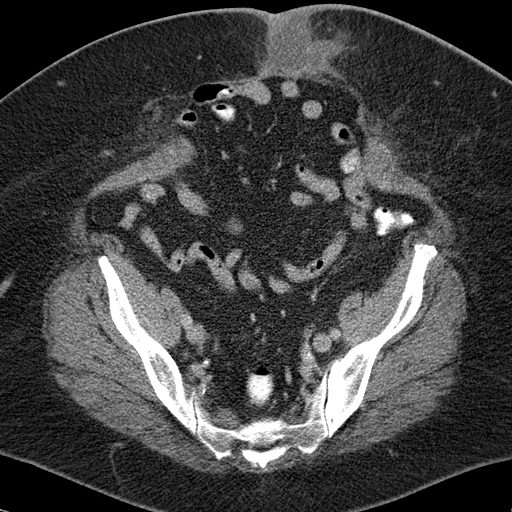
[im 38/89  soft-tissue]
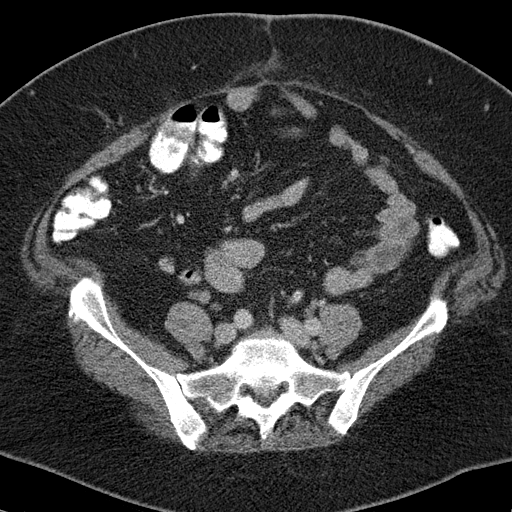
[im 47/89  soft-tissue]
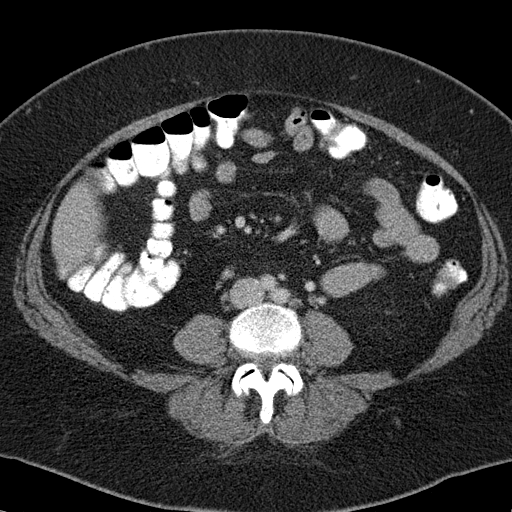
[im 51/89  soft-tissue]
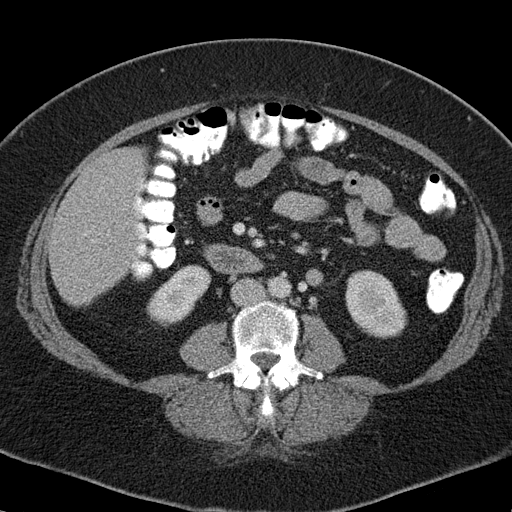
[im 59/89  soft-tissue]
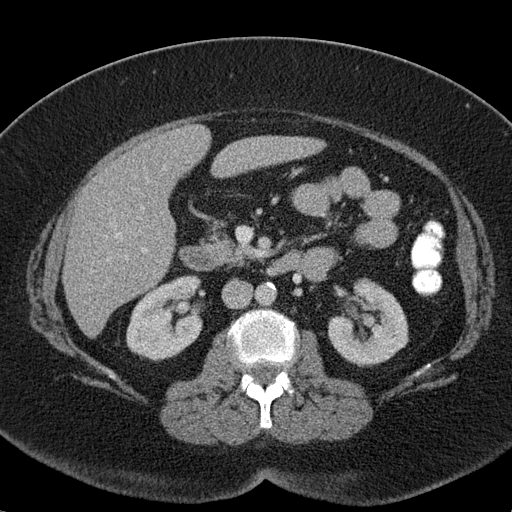
[im 59/89  bone]
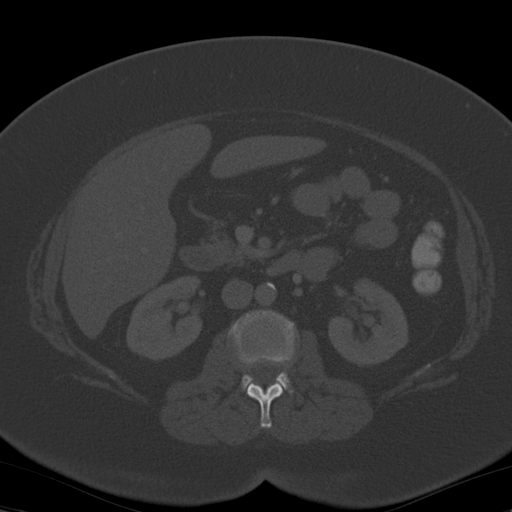
[im 63/89  soft-tissue]
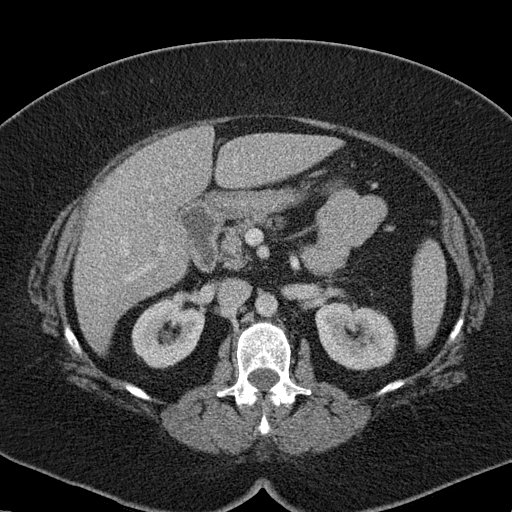
[im 72/89  soft-tissue]
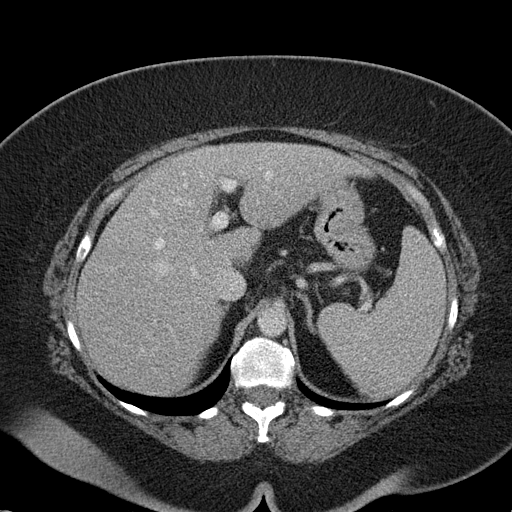
[im 76/89  soft-tissue]
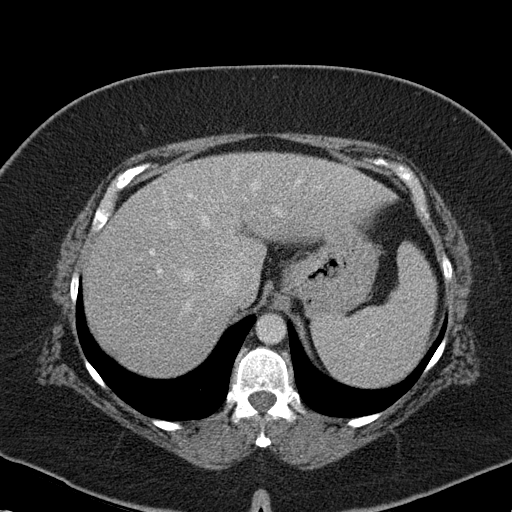
[im 84/89  soft-tissue]
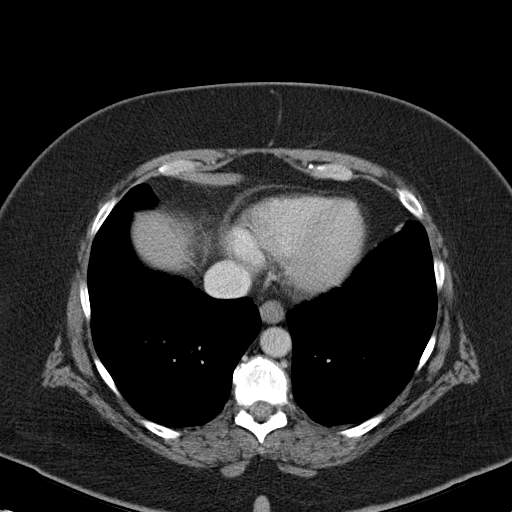

[Series 4: abd_pel_with 3.0 spo cor · coronal · 0.74mm/px · 3 of 86 slices shown]
[im 29/86  soft-tissue]
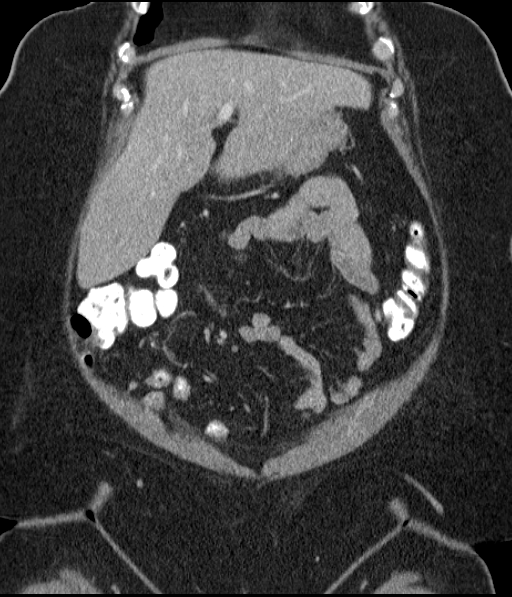
[im 38/86  soft-tissue]
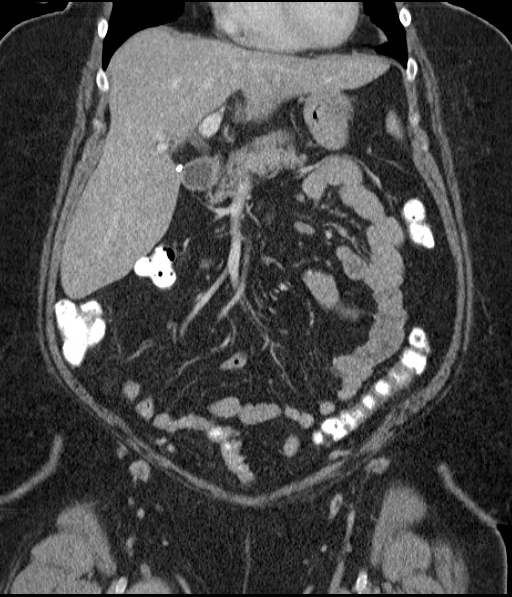
[im 48/86  soft-tissue]
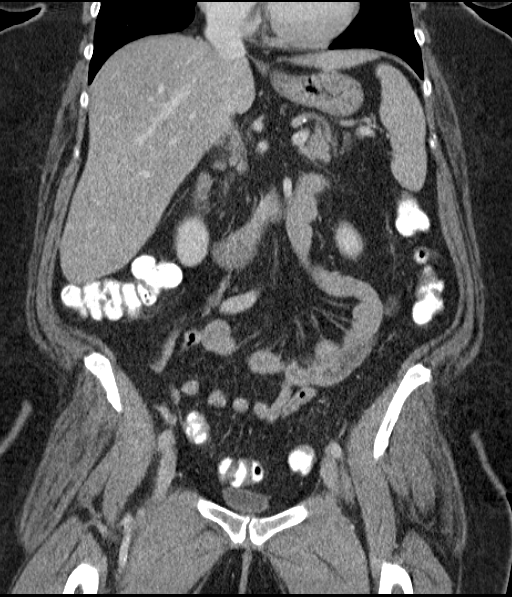

[16 of 46 positions shown; findings below may reference images not displayed]

FINDINGS: Lung Bases: Small hiatal hernia.  Otherwise, unremarkable.

Abdomen/Pelvis:  Status post cholecystectomy.  The appearance of
the liver, pancreas, spleen and bilateral adrenal glands is
unremarkable.  In the upper pole of the left kidney there is a
subcentimeter low attenuation lesion which is too small to
definitively characterize.  In the interpolar region of the right
kidney there is a 3 mm calcification which likely represents a
nonobstructive calculus.  Atherosclerosis throughout the abdominal
and pelvic vasculature, without definite aneurysm or dissection.

There is no ascites or pneumoperitoneum and no pathologic
distension of small bowel.  Normal appendix.  No definite
pathologic lymphadenopathy identified within the abdomen or pelvis.
Status post total abdominal hysterectomy and bilateral salpingo-
oophorectomy.

There is a large ventral hernia in the inferior aspect of the
abdominal wall containing multiple loops of small bowel, without
evidence of incarceration or obstruction.  The subcutaneous fat
around the hernia demonstrates extensive soft tissue stranding
suggesting inflammation.  Additionally, adjacent to the umbilicus,
there is a 4.0 x 4.3 cm rim enhancing low attenuation fluid
collection compatible with an abscess.  This is best demonstrated
on image 60 of series 2.

Musculoskeletal: There are no aggressive appearing lytic or blastic
lesions noted in the visualized portions of the skeleton.
IMPRESSION: 1.  4.0 x 4.3 cm rim enhancing fluid collections immediately to the
left of the umbilicus superficial to the patient's large ventral
hernia, compatible with an abscess.  Given the extensive soft
tissue stranding in the adjacent subcutaneous fat, there appears to
be some surrounding cellulitis.
2.  Large ventral hernia containing several loops of small bowel,
without evidence of bowel incarceration or obstruction at this
time.

3.  Normal appendix.
4.  Status post cholecystectomy, total abdominal hysterectomy and
bilateral salpingo-oophorectomy.
5.  Small hiatal hernia.
6.  Additional incidental findings, as above.

## 2014-11-06 ENCOUNTER — Other Ambulatory Visit: Payer: Self-pay

## 2014-11-06 DIAGNOSIS — K460 Unspecified abdominal hernia with obstruction, without gangrene: Secondary | ICD-10-CM

## 2014-11-06 DIAGNOSIS — Z8619 Personal history of other infectious and parasitic diseases: Secondary | ICD-10-CM

## 2014-11-06 DIAGNOSIS — Z9889 Other specified postprocedural states: Secondary | ICD-10-CM

## 2014-11-06 DIAGNOSIS — R109 Unspecified abdominal pain: Secondary | ICD-10-CM

## 2014-11-06 DIAGNOSIS — Z8719 Personal history of other diseases of the digestive system: Secondary | ICD-10-CM

## 2014-11-12 ENCOUNTER — Emergency Department (HOSPITAL_COMMUNITY)
Admission: EM | Admit: 2014-11-12 | Discharge: 2014-11-12 | Disposition: A | Discharge status: Home / Self Care | Payer: Managed Care, Other (non HMO) | Attending: Emergency Medicine | Admitting: Emergency Medicine

## 2014-11-12 ENCOUNTER — Encounter (HOSPITAL_COMMUNITY): Payer: Self-pay | Admitting: *Deleted

## 2014-11-12 DIAGNOSIS — Z79899 Other long term (current) drug therapy: Secondary | ICD-10-CM | POA: Insufficient documentation

## 2014-11-12 DIAGNOSIS — Z87891 Personal history of nicotine dependence: Secondary | ICD-10-CM | POA: Insufficient documentation

## 2014-11-12 DIAGNOSIS — J45909 Unspecified asthma, uncomplicated: Secondary | ICD-10-CM | POA: Insufficient documentation

## 2014-11-12 DIAGNOSIS — Z792 Long term (current) use of antibiotics: Secondary | ICD-10-CM | POA: Insufficient documentation

## 2014-11-12 DIAGNOSIS — E785 Hyperlipidemia, unspecified: Secondary | ICD-10-CM | POA: Insufficient documentation

## 2014-11-12 DIAGNOSIS — Z8719 Personal history of other diseases of the digestive system: Secondary | ICD-10-CM | POA: Diagnosis not present

## 2014-11-12 DIAGNOSIS — Z90710 Acquired absence of both cervix and uterus: Secondary | ICD-10-CM | POA: Insufficient documentation

## 2014-11-12 DIAGNOSIS — Z9049 Acquired absence of other specified parts of digestive tract: Secondary | ICD-10-CM | POA: Diagnosis not present

## 2014-11-12 DIAGNOSIS — I10 Essential (primary) hypertension: Secondary | ICD-10-CM | POA: Diagnosis not present

## 2014-11-12 DIAGNOSIS — L02211 Cutaneous abscess of abdominal wall: Secondary | ICD-10-CM | POA: Diagnosis not present

## 2014-11-12 DIAGNOSIS — R1033 Periumbilical pain: Secondary | ICD-10-CM | POA: Diagnosis present

## 2014-11-12 DIAGNOSIS — Z7982 Long term (current) use of aspirin: Secondary | ICD-10-CM | POA: Diagnosis not present

## 2014-11-12 DIAGNOSIS — E119 Type 2 diabetes mellitus without complications: Secondary | ICD-10-CM | POA: Insufficient documentation

## 2014-11-12 DIAGNOSIS — Z791 Long term (current) use of non-steroidal anti-inflammatories (NSAID): Secondary | ICD-10-CM | POA: Diagnosis not present

## 2014-11-12 HISTORY — DX: Ventral hernia without obstruction or gangrene: K43.9

## 2014-11-12 LAB — COMPREHENSIVE METABOLIC PANEL
ALT: 23 U/L (ref 14–54)
AST: 29 U/L (ref 15–41)
Albumin: 3.7 g/dL (ref 3.5–5.0)
Alkaline Phosphatase: 77 U/L (ref 38–126)
Anion gap: 12 (ref 5–15)
BUN: 11 mg/dL (ref 6–20)
CALCIUM: 9.8 mg/dL (ref 8.9–10.3)
CO2: 23 mmol/L (ref 22–32)
Chloride: 106 mmol/L (ref 101–111)
Creatinine, Ser: 0.74 mg/dL (ref 0.44–1.00)
GFR calc non Af Amer: >60 mL/min (ref >60)
Glucose, Bld: 150 mg/dL — ABNORMAL HIGH (ref 65–99)
Potassium: 4.3 mmol/L (ref 3.5–5.1)
Sodium: 141 mmol/L (ref 135–145)
TOTAL PROTEIN: 7.2 g/dL (ref 6.5–8.1)
Total Bilirubin: 0.3 mg/dL (ref 0.3–1.2)

## 2014-11-12 LAB — I-STAT CHEM 8, ED
BUN: 13 mg/dL (ref 6–20)
CREATININE: 0.7 mg/dL (ref 0.44–1.00)
Calcium, Ion: 1.21 mmol/L (ref 1.12–1.23)
Chloride: 103 mmol/L (ref 101–111)
Glucose, Bld: 137 mg/dL — ABNORMAL HIGH (ref 65–99)
HEMATOCRIT: 37 % (ref 36.0–46.0)
HEMOGLOBIN: 12.6 g/dL (ref 12.0–15.0)
Potassium: 4.1 mmol/L (ref 3.5–5.1)
Sodium: 140 mmol/L (ref 135–145)
TCO2: 26 mmol/L (ref 0–100)

## 2014-11-12 LAB — CBC
HCT: 38.4 % (ref 36.0–46.0)
Hemoglobin: 12.6 g/dL (ref 12.0–15.0)
MCH: 29.4 pg (ref 26.0–34.0)
MCHC: 32.8 g/dL (ref 30.0–36.0)
MCV: 89.5 fL (ref 78.0–100.0)
Platelets: 332 10*3/uL (ref 150–400)
RBC: 4.29 MIL/uL (ref 3.87–5.11)
RDW: 14.1 % (ref 11.5–15.5)
WBC: 11 10*3/uL — ABNORMAL HIGH (ref 4.0–10.5)

## 2014-11-12 LAB — URINALYSIS, ROUTINE W REFLEX MICROSCOPIC
Bilirubin Urine: NEGATIVE
Glucose, UA: NEGATIVE mg/dL
Hgb urine dipstick: NEGATIVE
Ketones, ur: NEGATIVE mg/dL
LEUKOCYTES UA: NEGATIVE
Nitrite: NEGATIVE
Protein, ur: NEGATIVE mg/dL
SPECIFIC GRAVITY, URINE: 1.013 (ref 1.005–1.030)
Urobilinogen, UA: 1 mg/dL (ref 0.0–1.0)
pH: 6 (ref 5.0–8.0)

## 2014-11-12 MED ORDER — IBUPROFEN 800 MG PO TABS: 800.0000 mg | ORAL_TABLET | Freq: Once | ORAL | Status: DC

## 2014-11-12 MED ORDER — MORPHINE SULFATE 2 MG/ML IJ SOLN
2.0000 mg | Freq: Once | INTRAMUSCULAR | Status: AC
  Administered 2014-11-12: 2 mg via INTRAVENOUS
  Filled 2014-11-12: qty 1

## 2014-11-12 MED ORDER — ONDANSETRON HCL 4 MG/2ML IJ SOLN
4.0000 mg | Freq: Once | INTRAMUSCULAR | Status: AC
  Administered 2014-11-12: 4 mg via INTRAVENOUS
  Filled 2014-11-12: qty 2

## 2014-11-12 MED ORDER — HYDROCODONE-ACETAMINOPHEN 5-325 MG PO TABS: 1.0000 | ORAL_TABLET | ORAL | Status: DC | PRN

## 2014-11-12 MED ORDER — LIDOCAINE-EPINEPHRINE (PF) 2 %-1:200000 IJ SOLN: 20.0000 mL | Freq: Once | INTRAMUSCULAR | Status: DC

## 2014-11-12 MED ORDER — LIDOCAINE-EPINEPHRINE 1 %-1:100000 IJ SOLN
20.0000 mL | Freq: Once | INTRAMUSCULAR | Status: AC
  Administered 2014-11-12: 20 mL
  Filled 2014-11-12: qty 1

## 2014-11-12 NOTE — ED Notes (Signed)
Dr. Hulen Skains at bedside for I & D of abdominal abscess.

## 2014-11-12 NOTE — ED Notes (Signed)
Pt states that she has been having redness and swelling to her hernia site. States that she had a CT scan done after seeing Dr Johney Maine (Dr Hulen Skains is her normal surgeon). CT scan shows hernia sac with fat and small bowel loops within sac. Also shows fluid collection between hernia sac and umbilicus. States that Dr Hulen Skains sent pt to ED for admission for IV antibiotics/possible drain/possible surgery.

## 2014-11-12 NOTE — Procedures (Signed)
Incision and Drainage Procedure Note  Pre-operative Diagnosis: Abdominal wall abscess  Post-operative Diagnosis: Same  Indications: One week of worsening abdominal pain with redness and tenderness  Anesthesia: 1% lidocaine with epinephrine, total 16 cc used  Procedure Details  The procedure, risks and complications have been discussed in detail (including, but not limited to airway compromise, infection, bleeding) with the patient, and the patient has signed consent to the procedure.  The skin was sterilely prepped and draped over the affected area in the usual fashion. After adequate local anesthesia, I&D with a #11 blade was performed on the midline abdominal wall abscess.  3 cm incision made.. Purulent drainage: present The patient was observed until stable.  Findings: 30+ mml of mucopurulent drainage.  Subsequently packed with entire bottle of 1/4" iodoform NuGauze.  EBL: <5 cc's  Drains: Packed with Iodoform NuGauze  Condition: Tolerated procedure well and Stable  Patient was given a total of 6mg  of IV Morphine during the procedure, and she remained awake and alert throughout  Complications: none   Kathryne Eriksson. Dahlia Bailiff, MD, [place] [phone] [facility].

## 2014-11-12 NOTE — ED Provider Notes (Signed)
CSN: [id number]     Arrival date & time 11/12/14  1503 History   First MD Initiated Contact with Patient 11/12/14 1626     Chief Complaint  Patient presents with  . Hernia     (Consider location/radiation/quality/duration/timing/severity/associated sxs/prior Treatment) HPI Comments: The patient is a 59 year old female, she has a prior history of a hernia repair during a hysterectomy operation a couple of years ago. She had an incision and drainage of a postop abscess which had healed by secondary intent, over the last year she has been symptom-free but recently started having increasing abdominal pain and redness at that same site in the periumbilical region. She followed up with a general surgeon who ordered a CT scan, she presents today with the results of that CT scan showing a possible abscess. She denies systemic symptoms including fevers, chills, nausea, vomiting, diarrhea, constipation, she has minimal abdominal pain at this time. Symptoms are worse with palpation  The history is provided by the patient.    Past Medical History  Diagnosis Date  . Hyperlipidemia   . Asthma   . GERD (gastroesophageal reflux disease)   . Arthritis   . PONV (postoperative nausea and vomiting)   . Hypertension     on meds for 2 years  . Diabetes mellitus without complication     Type 2 NIDDM x 3 years  . Hernia, ventral    Past Surgical History  Procedure Laterality Date  . Hernia repair  2002  . Cholecystectomy  1991  . Tonsillectomy  1967  . Abdominal hysterectomy  2001  . Kidney stones  2000  . Incision and drainage abscess N/A 07/02/2012    Procedure: INCISION AND DRAINAGE ABDOMINAL WALL ABSCESS;  Surgeon: Gwenyth Ober, MD;  Location: [facility];  Service: General;  Laterality: N/A;   Family History  Problem Relation Age of Onset  . Hypertension Mother   . Alzheimer's disease Mother    Social History  Substance Use Topics  . Smoking status: Former Smoker -- 1.00 packs/day for 4 years   Types: Cigarettes  . Smokeless tobacco: None     Comment: quit smoking 36 years ago  . Alcohol Use: Yes     Comment: rare; 3 x year   OB History    No data available     Review of Systems  All other systems reviewed and are negative.     Allergies  Hydrochlorothiazide; Morphine and related; Tape; and Lisinopril  Home Medications   Prior to Admission medications   Medication Sig Start Date End Date Taking? Authorizing Provider  amoxicillin-clavulanate (AUGMENTIN) 875-125 MG per tablet Take 1 tablet by mouth 2 (two) times daily. 11/06/14 11/13/14 Yes Historical Provider, MD  aspirin EC 81 MG tablet Take 81 mg by mouth daily.   Yes Historical Provider, MD  calcium carbonate (TUMS - DOSED IN MG ELEMENTAL CALCIUM) 500 MG chewable tablet Chew 1 tablet by mouth 2 (two) times daily as needed for indigestion or heartburn.   Yes Historical Provider, MD  cetirizine (ZYRTEC) 10 MG tablet Take 10 mg by mouth daily.   Yes Historical Provider, MD  glipiZIDE (GLUCOTROL) 10 MG tablet Take 5-10 mg by mouth 2 (two) times daily before a meal. Take 10mg  in AM and 5mg  in PM   Yes Historical Provider, MD  losartan (COZAAR) 100 MG tablet Take 100 mg by mouth daily.   Yes Historical Provider, MD  metFORMIN (GLUCOPHAGE) 500 MG tablet Take 500-1,000 mg by mouth 2 (two) times daily  with a meal. Take 2 tablets in AM and 1 tablet in PM   Yes Historical Provider, MD  metoprolol succinate (TOPROL-XL) 50 MG 24 hr tablet Take 25-50 mg by mouth daily. Take with or immediately following a meal.  Takes 25mg  in am and 50mg  in pm   Yes Historical Provider, MD  mometasone-formoterol (DULERA) 100-5 MCG/ACT AERO Inhale 2 puffs into the lungs 2 (two) times daily.   Yes Historical Provider, MD  naproxen sodium (ANAPROX) 220 MG tablet Take 440 mg by mouth daily as needed (for pain).   Yes Historical Provider, MD  omeprazole (PRILOSEC) 20 MG capsule Take 20 mg by mouth daily.   Yes Historical Provider, MD  simvastatin (ZOCOR) 40  MG tablet Take 40 mg by mouth every evening.   Yes Historical Provider, MD   BP 153/73 mmHg  Pulse 87  Temp(Src) 98.1 F (36.7 C) (Oral)  Resp 14  SpO2 96% Physical Exam  Constitutional: She appears well-developed and well-nourished. No distress.  HENT:  Head: Normocephalic and atraumatic.  Mouth/Throat: Oropharynx is clear and moist. No oropharyngeal exudate.  Eyes: Conjunctivae and EOM are normal. Pupils are equal, round, and reactive to light. Right eye exhibits no discharge. Left eye exhibits no discharge. No scleral icterus.  Neck: Normal range of motion. Neck supple. No JVD present. No thyromegaly present.  Cardiovascular: Normal rate, regular rhythm, normal heart sounds and intact distal pulses.  Exam reveals no gallop and no friction rub.   No murmur heard. Pulmonary/Chest: Effort normal and breath sounds normal. No respiratory distress. She has no wheezes. She has no rales.  Abdominal: Soft. Bowel sounds are normal. She exhibits no distension and no mass. There is no tenderness.  Obese abdomen, globally nontender except for a focal area in the periumbilical region where there is a palpable mass, overlying erythema and tenderness, no peritonitis  Musculoskeletal: Normal range of motion. She exhibits no edema or tenderness.  Lymphadenopathy:    She has no cervical adenopathy.  Neurological: She is alert. Coordination normal.  Skin: Skin is warm and dry. No rash noted. No erythema.  Psychiatric: She has a normal mood and affect. Her behavior is normal.  Nursing note and vitals reviewed.   ED Course  Procedures (including critical care time) Labs Review Labs Reviewed  COMPREHENSIVE METABOLIC PANEL - Abnormal; Notable for the following:    Glucose, Bld 150 (*)    All other components within normal limits  CBC - Abnormal; Notable for the following:    WBC 11.0 (*)    All other components within normal limits  I-STAT CHEM 8, ED - Abnormal; Notable for the following:     Glucose, Bld 137 (*)    All other components within normal limits  WOUND CULTURE  CULTURE, ROUTINE-ABSCESS  URINALYSIS, ROUTINE W REFLEX MICROSCOPIC (NOT AT [facility]    Imaging Review No results found.    MDM   Final diagnoses:  None    Overall well-appearing, vital signs normal, CT scan impressions reviewed, will discussed with general surgery. The patient does not appear systemically ill. Currently taking Augmentin.  D/w Dr. Hulen Skains - he has draineda bscess and will d/c pt home.  Meds given in ED:  Medications  morphine 2 MG/ML injection 2 mg (2 mg Intravenous Given 11/12/14 1751)  ondansetron (ZOFRAN) injection 4 mg (4 mg Intravenous Given 11/12/14 1750)  lidocaine-EPINEPHrine (XYLOCAINE W/EPI) 1 %-1:100000 (with pres) injection 20 mL (20 mLs Infiltration Given 11/12/14 1750)  morphine 2 MG/ML injection 2 mg (  2 mg Intravenous Given 11/12/14 1758)  morphine 2 MG/ML injection 2 mg (2 mg Intravenous Given 11/12/14 1812)    New Prescriptions   No medications on file      Noemi Chapel, MD 11/12/14 [phone]

## 2014-11-12 NOTE — Consult Note (Signed)
Reason for Consult:Abdominal wall abscess  Referring Physician: Adaliz, Leslie [name] is an 59 y.o. female.  HPI: Pain for a week.  Prior history of abdominal wall abscess.    Past Medical History  Diagnosis Date  . Hyperlipidemia   . Asthma   . GERD (gastroesophageal reflux disease)   . Arthritis   . PONV (postoperative nausea and vomiting)   . Hypertension     on meds for 2 years  . Diabetes mellitus without complication     Type 2 NIDDM x 3 years  . Hernia, ventral     Past Surgical History  Procedure Laterality Date  . Hernia repair  2002  . Cholecystectomy  1991  . Tonsillectomy  1967  . Abdominal hysterectomy  2001  . Kidney stones  2000  . Incision and drainage abscess N/A 07/02/2012    Procedure: INCISION AND DRAINAGE ABDOMINAL WALL ABSCESS;  Surgeon: Leslie Ober, MD;  Location: [facility];  Service: General;  Laterality: N/A;    Family History  Problem Relation Age of Onset  . Hypertension Mother   . Alzheimer's disease Mother     Social History:  reports that she has quit smoking. Her smoking use included Cigarettes. She has a 4 pack-year smoking history. She does not have any smokeless tobacco history on file. She reports that she drinks alcohol. She reports that she does not use illicit drugs.  Allergies:  Allergies  Allergen Reactions  . Hydrochlorothiazide Other (See Comments)    Caused High sugars  . Morphine And Related Nausea And Vomiting  . Tape Other (See Comments)    And bandaids also, causes skin rawness and tears  . Lisinopril Cough    Medications: I have reviewed the patient's current medications.  Results for orders placed or performed during the hospital encounter of 11/12/14 (from the past 48 hour(s))  Comprehensive metabolic panel     Status: Abnormal   Collection Time: 11/12/14  3:55 PM  Result Value Ref Range   Sodium 141 135 - 145 mmol/L   Potassium 4.3 3.5 - 5.1 mmol/L   Chloride 106 101 - 111 mmol/L   CO2 23 22 - 32 mmol/L     Glucose, Bld 150 (H) 65 - 99 mg/dL   BUN 11 6 - 20 mg/dL   Creatinine, Ser 0.74 0.44 - 1.00 mg/dL   Calcium 9.8 8.9 - 10.3 mg/dL   Total Protein 7.2 6.5 - 8.1 g/dL   Albumin 3.7 3.5 - 5.0 g/dL   AST 29 15 - 41 U/L   ALT 23 14 - 54 U/L   Alkaline Phosphatase 77 38 - 126 U/L   Total Bilirubin 0.3 0.3 - 1.2 mg/dL   GFR calc non Af Amer >60 >60 mL/min   GFR calc Af Amer >60 >60 mL/min    Comment: (NOTE) The eGFR has been calculated using the CKD EPI equation. This calculation has not been validated in all clinical situations. eGFR's persistently <60 mL/min signify possible Chronic Kidney Disease.    Anion gap 12 5 - 15  CBC     Status: Abnormal   Collection Time: 11/12/14  3:55 PM  Result Value Ref Range   WBC 11.0 (H) 4.0 - 10.5 K/uL   RBC 4.29 3.87 - 5.11 MIL/uL   Hemoglobin 12.6 12.0 - 15.0 g/dL   HCT 38.4 36.0 - 46.0 %   MCV 89.5 78.0 - 100.0 fL   MCH 29.4 26.0 - 34.0 pg   MCHC 32.8 30.0 -  36.0 g/dL   RDW 14.1 11.5 - 15.5 %   Platelets 332 150 - 400 K/uL  Urinalysis, Routine w reflex microscopic (not at [facility])     Status: None   Collection Time: 11/12/14  4:02 PM  Result Value Ref Range   Color, Urine YELLOW YELLOW   APPearance CLEAR CLEAR   Specific Gravity, Urine 1.013 1.005 - 1.030   pH 6.0 5.0 - 8.0   Glucose, UA NEGATIVE NEGATIVE mg/dL   Hgb urine dipstick NEGATIVE NEGATIVE   Bilirubin Urine NEGATIVE NEGATIVE   Ketones, ur NEGATIVE NEGATIVE mg/dL   Protein, ur NEGATIVE NEGATIVE mg/dL   Urobilinogen, UA 1.0 0.0 - 1.0 mg/dL   Nitrite NEGATIVE NEGATIVE   Leukocytes, UA NEGATIVE NEGATIVE    Comment: MICROSCOPIC NOT DONE ON URINES WITH NEGATIVE PROTEIN, BLOOD, LEUKOCYTES, NITRITE, OR GLUCOSE <1000 mg/dL.  I-stat chem 8, ed     Status: Abnormal   Collection Time: 11/12/14  5:14 PM  Result Value Ref Range   Sodium 140 135 - 145 mmol/L   Potassium 4.1 3.5 - 5.1 mmol/L   Chloride 103 101 - 111 mmol/L   BUN 13 6 - 20 mg/dL   Creatinine, Ser 0.70 0.44 - 1.00 mg/dL    Glucose, Bld 137 (H) 65 - 99 mg/dL   Calcium, Ion 1.21 1.12 - 1.23 mmol/L   TCO2 26 0 - 100 mmol/L   Hemoglobin 12.6 12.0 - 15.0 g/dL   HCT 37.0 36.0 - 46.0 %    No results found.  Review of Systems  Gastrointestinal: Positive for abdominal pain (Focal pain at the site of previous I&D).  All other systems reviewed and are negative.  Blood pressure 156/71, pulse 86, temperature 98.1 F (36.7 C), temperature source Oral, resp. rate 14, SpO2 99 %. Physical Exam  Vitals reviewed. Constitutional: She is oriented to person, place, and time. She appears well-developed.  Morbidly obese  HENT:  Head: Normocephalic and atraumatic.  Eyes: Pupils are equal, round, and reactive to light.  Neck: Normal range of motion.  Cardiovascular: Normal rate, regular rhythm and normal heart sounds.   Respiratory: Effort normal and breath sounds normal.  GI: Bowel sounds are normal. There is tenderness. There is no rigidity, no rebound and no guarding.    Musculoskeletal: Normal range of motion.  Neurological: She is alert and oriented to person, place, and time.  Skin: Skin is warm and dry.  Psychiatric: She has a normal mood and affect. Her behavior is normal. Judgment and thought content normal.    Assessment/Plan: Abdominal wall abscess, drained in the ED, 30+ cc mucopurulent drainage.  Culture sent, patient on Augmentin prior to procedure.  Will need follow-up in office for packing removal and repacking on Friday.  Leslie [name] 11/12/2014, 5:41 PM

## 2014-11-16 LAB — CULTURE, ROUTINE-ABSCESS

## 2014-11-17 ENCOUNTER — Telehealth (HOSPITAL_COMMUNITY): Payer: Self-pay | Admitting: *Deleted

## 2015-09-23 ENCOUNTER — Other Ambulatory Visit (HOSPITAL_COMMUNITY): Payer: Managed Care, Other (non HMO)

## 2015-09-28 ENCOUNTER — Ambulatory Visit (HOSPITAL_COMMUNITY)
Admission: RE | Admit: 2015-09-28 | Payer: Managed Care, Other (non HMO) | Source: Ambulatory Visit | Admitting: Ophthalmology

## 2015-09-28 ENCOUNTER — Encounter (HOSPITAL_COMMUNITY): Admission: RE | Payer: Self-pay | Source: Ambulatory Visit

## 2015-09-28 SURGERY — PHACOEMULSIFICATION, CATARACT, WITH IOL INSERTION
Anesthesia: Monitor Anesthesia Care | Site: Eye | Laterality: Right

## 2015-11-11 ENCOUNTER — Other Ambulatory Visit (HOSPITAL_COMMUNITY): Payer: Managed Care, Other (non HMO)

## 2015-11-11 NOTE — Patient Instructions (Signed)
Your procedure is scheduled on: 11/16/2015  Report to [facility] at  17  AM.  Call this number if you have problems the morning of surgery: [phone]   Do not eat food or drink liquids :After Midnight.      Take these medicines the morning of surgery with A SIP OF WATER: zyrtec, hydrocodone,metoprolol, prilosec. Take your nebulizer before you come.   Do not wear jewelry, make-up or nail polish.  Do not wear lotions, powders, or perfumes. You may wear deodorant.  Do not shave 48 hours prior to surgery.  Do not bring valuables to the hospital.  Contacts, dentures or bridgework may not be worn into surgery.  Leave suitcase in the car. After surgery it may be brought to your room.  For patients admitted to the hospital, checkout time is 11:00 AM the day of discharge.   Patients discharged the day of surgery will not be allowed to drive home.  :     Please read over the following fact sheets that you were given: Coughing and Deep Breathing, Surgical Site Infection Prevention, Anesthesia Post-op Instructions and Care and Recovery After Surgery    Cataract A cataract is a clouding of the lens of the eye. When a lens becomes cloudy, vision is reduced based on the degree and nature of the clouding. Many cataracts reduce vision to some degree. Some cataracts make people more near-sighted as they develop. Other cataracts increase glare. Cataracts that are ignored and become worse can sometimes look white. The white color can be seen through the pupil. CAUSES   Aging. However, cataracts may occur at any age, even in newborns.   Certain drugs.   Trauma to the eye.   Certain diseases such as diabetes.   Specific eye diseases such as chronic inflammation inside the eye or a sudden attack of a rare form of glaucoma.   Inherited or acquired medical problems.  SYMPTOMS   Gradual, progressive drop in vision in the affected eye.   Severe, rapid visual loss. This most often happens when trauma  is the cause.  DIAGNOSIS  To detect a cataract, an eye doctor examines the lens. Cataracts are best diagnosed with an exam of the eyes with the pupils enlarged (dilated) by drops.  TREATMENT  For an early cataract, vision may improve by using different eyeglasses or stronger lighting. If that does not help your vision, surgery is the only effective treatment. A cataract needs to be surgically removed when vision loss interferes with your everyday activities, such as driving, reading, or watching TV. A cataract may also have to be removed if it prevents examination or treatment of another eye problem. Surgery removes the cloudy lens and usually replaces it with a substitute lens (intraocular lens, IOL).  At a time when both you and your doctor agree, the cataract will be surgically removed. If you have cataracts in both eyes, only one is usually removed at a time. This allows the operated eye to heal and be out of danger from any possible problems after surgery (such as infection or poor wound healing). In rare cases, a cataract may be doing damage to your eye. In these cases, your caregiver may advise surgical removal right away. The vast majority of people who have cataract surgery have better vision afterward. HOME CARE INSTRUCTIONS  If you are not planning surgery, you may be asked to do the following:  Use different eyeglasses.   Use stronger or brighter lighting.   Ask your  eye doctor about reducing your medicine dose or changing medicines if it is thought that a medicine caused your cataract. Changing medicines does not make the cataract go away on its own.   Become familiar with your surroundings. Poor vision can lead to injury. Avoid bumping into things on the affected side. You are at a higher risk for tripping or falling.   Exercise extreme care when driving or operating machinery.   Wear sunglasses if you are sensitive to bright light or experiencing problems with glare.  SEEK  IMMEDIATE MEDICAL CARE IF:   You have a worsening or sudden vision loss.   You notice redness, swelling, or increasing pain in the eye.   You have a fever.  Document Released: 03/21/2005 Document Revised: 03/10/2011 Document Reviewed: 11/12/2010 [facility] Patient Information 2012 [place].PATIENT INSTRUCTIONS POST-ANESTHESIA  IMMEDIATELY FOLLOWING SURGERY:  Do not drive or operate machinery for the first twenty four hours after surgery.  Do not make any important decisions for twenty four hours after surgery or while taking narcotic pain medications or sedatives.  If you develop intractable nausea and vomiting or a severe headache please notify your doctor immediately.  FOLLOW-UP:  Please make an appointment with your surgeon as instructed. You do not need to follow up with anesthesia unless specifically instructed to do so.  WOUND CARE INSTRUCTIONS (if applicable):  Keep a dry clean dressing on the anesthesia/puncture wound site if there is drainage.  Once the wound has quit draining you may leave it open to air.  Generally you should leave the bandage intact for twenty four hours unless there is drainage.  If the epidural site drains for more than 36-48 hours please call the anesthesia department.  QUESTIONS?:  Please feel free to call your physician or the hospital operator if you have any questions, and they will be happy to assist you.

## 2015-11-12 ENCOUNTER — Encounter (HOSPITAL_COMMUNITY): Payer: Self-pay

## 2015-11-12 ENCOUNTER — Other Ambulatory Visit: Payer: Self-pay

## 2015-11-12 ENCOUNTER — Encounter (HOSPITAL_COMMUNITY)
Admission: RE | Admit: 2015-11-12 | Discharge: 2015-11-12 | Disposition: A | Discharge status: Home / Self Care | Payer: Managed Care, Other (non HMO) | Source: Ambulatory Visit | Attending: Ophthalmology | Admitting: Ophthalmology

## 2015-11-12 DIAGNOSIS — Z01810 Encounter for preprocedural cardiovascular examination: Secondary | ICD-10-CM | POA: Insufficient documentation

## 2015-11-12 DIAGNOSIS — Z01812 Encounter for preprocedural laboratory examination: Secondary | ICD-10-CM | POA: Diagnosis present

## 2015-11-12 HISTORY — DX: Malignant (primary) neoplasm, unspecified: C80.1

## 2015-11-12 HISTORY — DX: Personal history of urinary calculi: Z87.442

## 2015-11-12 LAB — CBC WITH DIFFERENTIAL/PLATELET
Basophils Absolute: 0 10*3/uL (ref 0.0–0.1)
Basophils Relative: 0 %
EOS ABS: 0.5 10*3/uL (ref 0.0–0.7)
Eosinophils Relative: 5 %
HEMATOCRIT: 36 % (ref 36.0–46.0)
HEMOGLOBIN: 11.6 g/dL — AB (ref 12.0–15.0)
LYMPHS ABS: 2.4 10*3/uL (ref 0.7–4.0)
Lymphocytes Relative: 24 %
MCH: 28.8 pg (ref 26.0–34.0)
MCHC: 32.2 g/dL (ref 30.0–36.0)
MCV: 89.3 fL (ref 78.0–100.0)
Monocytes Absolute: 0.5 10*3/uL (ref 0.1–1.0)
Monocytes Relative: 5 %
NEUTROS PCT: 66 %
Neutro Abs: 6.7 10*3/uL (ref 1.7–7.7)
Platelets: 374 10*3/uL (ref 150–400)
RBC: 4.03 MIL/uL (ref 3.87–5.11)
RDW: 14.5 % (ref 11.5–15.5)
WBC: 10 10*3/uL (ref 4.0–10.5)

## 2015-11-12 LAB — BASIC METABOLIC PANEL
Anion gap: 8 (ref 5–15)
BUN: 16 mg/dL (ref 6–20)
CHLORIDE: 102 mmol/L (ref 101–111)
CO2: 26 mmol/L (ref 22–32)
Calcium: 8.9 mg/dL (ref 8.9–10.3)
Creatinine, Ser: 0.75 mg/dL (ref 0.44–1.00)
GFR calc non Af Amer: >60 mL/min (ref >60)
Glucose, Bld: 308 mg/dL — ABNORMAL HIGH (ref 65–99)
POTASSIUM: 4.8 mmol/L (ref 3.5–5.1)
SODIUM: 136 mmol/L (ref 135–145)

## 2015-11-12 NOTE — Pre-Procedure Instructions (Signed)
Patient given information to sign up for my chart at home. 

## 2015-11-16 ENCOUNTER — Encounter (HOSPITAL_COMMUNITY)
Admission: RE | Disposition: A | Discharge status: Home / Self Care | Payer: Self-pay | Source: Ambulatory Visit | Attending: Ophthalmology

## 2015-11-16 ENCOUNTER — Encounter (HOSPITAL_COMMUNITY): Payer: Self-pay | Admitting: Anesthesiology

## 2015-11-16 ENCOUNTER — Ambulatory Visit (HOSPITAL_COMMUNITY): Payer: Managed Care, Other (non HMO) | Admitting: Anesthesiology

## 2015-11-16 ENCOUNTER — Ambulatory Visit (HOSPITAL_COMMUNITY)
Admission: RE | Admit: 2015-11-16 | Discharge: 2015-11-16 | Disposition: A | Discharge status: Home / Self Care | Payer: Managed Care, Other (non HMO) | Source: Ambulatory Visit | Attending: Ophthalmology | Admitting: Ophthalmology

## 2015-11-16 DIAGNOSIS — K219 Gastro-esophageal reflux disease without esophagitis: Secondary | ICD-10-CM | POA: Diagnosis not present

## 2015-11-16 DIAGNOSIS — Z87891 Personal history of nicotine dependence: Secondary | ICD-10-CM | POA: Diagnosis not present

## 2015-11-16 DIAGNOSIS — M1990 Unspecified osteoarthritis, unspecified site: Secondary | ICD-10-CM | POA: Insufficient documentation

## 2015-11-16 DIAGNOSIS — J45909 Unspecified asthma, uncomplicated: Secondary | ICD-10-CM | POA: Diagnosis not present

## 2015-11-16 DIAGNOSIS — I10 Essential (primary) hypertension: Secondary | ICD-10-CM | POA: Diagnosis not present

## 2015-11-16 DIAGNOSIS — H25811 Combined forms of age-related cataract, right eye: Secondary | ICD-10-CM | POA: Insufficient documentation

## 2015-11-16 DIAGNOSIS — E119 Type 2 diabetes mellitus without complications: Secondary | ICD-10-CM | POA: Diagnosis not present

## 2015-11-16 HISTORY — PX: CATARACT EXTRACTION W/PHACO: SHX586

## 2015-11-16 LAB — GLUCOSE, CAPILLARY: Glucose-Capillary: 152 mg/dL — ABNORMAL HIGH (ref 65–99)

## 2015-11-16 SURGERY — PHACOEMULSIFICATION, CATARACT, WITH IOL INSERTION
Anesthesia: Monitor Anesthesia Care | Site: Eye | Laterality: Right

## 2015-11-16 MED ORDER — LIDOCAINE HCL 3.5 % OP GEL
1.0000 "application " | Freq: Once | OPHTHALMIC | Status: AC
  Administered 2015-11-16: 1 via OPHTHALMIC

## 2015-11-16 MED ORDER — TETRACAINE HCL 0.5 % OP SOLN
1.0000 [drp] | OPHTHALMIC | Status: AC
  Administered 2015-11-16 (×3): 1 [drp] via OPHTHALMIC

## 2015-11-16 MED ORDER — FENTANYL CITRATE (PF) 100 MCG/2ML IJ SOLN
25.0000 ug | INTRAMUSCULAR | Status: AC | PRN
  Administered 2015-11-16 (×2): 25 ug via INTRAVENOUS

## 2015-11-16 MED ORDER — LACTATED RINGERS IV SOLN
INTRAVENOUS | Status: DC
  Administered 2015-11-16: 11:00:00 via INTRAVENOUS

## 2015-11-16 MED ORDER — LIDOCAINE 3.5 % OP GEL OPTIME - NO CHARGE
OPHTHALMIC | Status: DC | PRN
  Administered 2015-11-16: 2 [drp] via OPHTHALMIC

## 2015-11-16 MED ORDER — LIDOCAINE HCL (PF) 1 % IJ SOLN
INTRAMUSCULAR | Status: DC | PRN
  Administered 2015-11-16: .5 mL

## 2015-11-16 MED ORDER — POVIDONE-IODINE 5 % OP SOLN
OPHTHALMIC | Status: DC | PRN
  Administered 2015-11-16: 1 via OPHTHALMIC

## 2015-11-16 MED ORDER — CYCLOPENTOLATE-PHENYLEPHRINE 0.2-1 % OP SOLN
1.0000 [drp] | OPHTHALMIC | Status: AC
  Administered 2015-11-16 (×3): 1 [drp] via OPHTHALMIC

## 2015-11-16 MED ORDER — MIDAZOLAM HCL 2 MG/2ML IJ SOLN
1.0000 mg | INTRAMUSCULAR | Status: DC | PRN
  Administered 2015-11-16: 2 mg via INTRAVENOUS

## 2015-11-16 MED ORDER — NEOMYCIN-POLYMYXIN-DEXAMETH 3.5-10000-0.1 OP SUSP
OPHTHALMIC | Status: DC | PRN
  Administered 2015-11-16: 2 [drp] via OPHTHALMIC

## 2015-11-16 MED ORDER — LACTATED RINGERS IV SOLN
INTRAVENOUS | Status: DC | PRN
  Administered 2015-11-16: 10:00:00 via INTRAVENOUS

## 2015-11-16 MED ORDER — BSS IO SOLN
INTRAOCULAR | Status: DC | PRN
  Administered 2015-11-16: 15 mL via INTRAOCULAR

## 2015-11-16 MED ORDER — EPINEPHRINE HCL 1 MG/ML IJ SOLN
INTRAMUSCULAR | Status: AC
  Filled 2015-11-16: qty 1

## 2015-11-16 MED ORDER — EPINEPHRINE HCL 1 MG/ML IJ SOLN
INTRAOCULAR | Status: DC | PRN
  Administered 2015-11-16: 500 mL

## 2015-11-16 MED ORDER — MIDAZOLAM HCL 2 MG/2ML IJ SOLN
INTRAMUSCULAR | Status: AC
  Filled 2015-11-16: qty 2

## 2015-11-16 MED ORDER — PHENYLEPHRINE HCL 2.5 % OP SOLN
1.0000 [drp] | OPHTHALMIC | Status: AC
  Administered 2015-11-16 (×3): 1 [drp] via OPHTHALMIC

## 2015-11-16 MED ORDER — FENTANYL CITRATE (PF) 100 MCG/2ML IJ SOLN
INTRAMUSCULAR | Status: AC
  Filled 2015-11-16: qty 2

## 2015-11-16 MED ORDER — PROVISC 10 MG/ML IO SOLN
INTRAOCULAR | Status: DC | PRN
  Administered 2015-11-16: 0.85 mL via INTRAOCULAR

## 2015-11-16 SURGICAL SUPPLY — 23 items
CAPSULAR TENSION RING-AMO (OPHTHALMIC RELATED) IMPLANT
CLOTH BEACON ORANGE TIMEOUT ST (SAFETY) ×3 IMPLANT
EYE SHIELD UNIVERSAL CLEAR (GAUZE/BANDAGES/DRESSINGS) ×3 IMPLANT
GLOVE BIOGEL PI IND STRL 7.0 (GLOVE) ×2 IMPLANT
GLOVE BIOGEL PI IND STRL 7.5 (GLOVE) IMPLANT
GLOVE BIOGEL PI INDICATOR 7.0 (GLOVE) ×4
GLOVE BIOGEL PI INDICATOR 7.5 (GLOVE)
GLOVE EXAM NITRILE LRG STRL (GLOVE) IMPLANT
GLOVE EXAM NITRILE MD LF STRL (GLOVE) IMPLANT
KIT VITRECTOMY (OPHTHALMIC RELATED) IMPLANT
PAD ARMBOARD 7.5X6 YLW CONV (MISCELLANEOUS) ×3 IMPLANT
PROC W NO LENS (INTRAOCULAR LENS)
PROC W SPEC LENS (INTRAOCULAR LENS)
PROCESS W NO LENS (INTRAOCULAR LENS) IMPLANT
PROCESS W SPEC LENS (INTRAOCULAR LENS) IMPLANT
RETRACTOR IRIS SIGHTPATH (OPHTHALMIC RELATED) IMPLANT
RING MALYGIN (MISCELLANEOUS) IMPLANT
SIGHTPATH CAT PROC W REG LENS (Ophthalmic Related) ×3 IMPLANT
SYRINGE LUER LOK 1CC (MISCELLANEOUS) ×3 IMPLANT
TAPE SURG TRANSPORE 1 IN (GAUZE/BANDAGES/DRESSINGS) ×1 IMPLANT
TAPE SURGICAL TRANSPORE 1 IN (GAUZE/BANDAGES/DRESSINGS) ×2
VISCOELASTIC ADDITIONAL (OPHTHALMIC RELATED) IMPLANT
WATER STERILE IRR 250ML POUR (IV SOLUTION) ×3 IMPLANT

## 2015-11-16 NOTE — Anesthesia Procedure Notes (Signed)
Procedure Name: MAC Date/Time: 11/16/2015 11:13 AM Performed by: Andree Elk, Eran Windish A Pre-anesthesia Checklist: Patient identified, Timeout performed, Emergency Drugs available and Suction available Oxygen Delivery Method: Nasal cannula

## 2015-11-16 NOTE — Op Note (Signed)
Date of Admission: 11/16/2015  Date of Surgery: 11/16/2015   Pre-Op Dx: Cataract Right Eye  Post-Op Dx: Senile Combined Cataract Right  Eye,  Dx Code [id number]  Surgeon: Tonny Branch, M.D.  Assistants: None  Anesthesia: Topical with MAC  Indications: Painless, progressive loss of vision with compromise of daily activities.  Surgery: Cataract Extraction with Intraocular lens Implant Right Eye  Discription: The patient had dilating drops and viscous lidocaine placed into the Right eye in the pre-op holding area. After transfer to the operating room, a time out was performed. The patient was then prepped and draped. Beginning with a 72 degree blade a paracentesis port was made at the surgeon's 2 o'clock position. The anterior chamber was then filled with 1% non-preserved lidocaine. This was followed by filling the anterior chamber with Provisc.  A 2.65mm keratome blade was used to make a clear corneal incision at the temporal limbus.  A bent cystatome needle was used to create a continuous tear capsulotomy. Hydrodissection was performed with balanced salt solution on a Fine canula. The lens nucleus was then removed using the phacoemulsification handpiece. Residual cortex was removed with the I&A handpiece. The anterior chamber and capsular bag were refilled with Provisc. A posterior chamber intraocular lens was placed into the capsular bag with it's injector. The implant was positioned with the Kuglan hook. The Provisc was then removed from the anterior chamber and capsular bag with the I&A handpiece. Stromal hydration of the main incision and paracentesis port was performed with BSS on a Fine canula. The wounds were tested for leak which was negative. The patient tolerated the procedure well. There were no operative complications. The patient was then transferred to the recovery room in stable condition.  Complications: None  Specimen: None  EBL: None  Prosthetic device: Hoya iSert 250, power 21.5  D, SN [id number].

## 2015-11-16 NOTE — Transfer of Care (Signed)
Immediate Anesthesia Transfer of Care Note  Patient: Leslie [name]  Procedure(s) Performed: Procedure(s): CATARACT EXTRACTION PHACO AND INTRAOCULAR LENS PLACEMENT RIGHT EYE; CDE:  8.65 (Right)  Patient Location: Short Stay  Anesthesia Type:MAC  Level of Consciousness: awake, alert , oriented and patient cooperative  Airway & Oxygen Therapy: Patient Spontanous Breathing  Post-op Assessment: Report given to RN and Post -op Vital signs reviewed and stable  Post vital signs: Reviewed and stable  Last Vitals:  Vitals:   11/16/15 1105 11/16/15 1110  BP:    Resp: 12 19  Temp:      Last Pain:  Vitals:   11/16/15 0956  TempSrc: Oral      Patients Stated Pain Goal: 7 ([id number])  Complications: No apparent anesthesia complications

## 2015-11-16 NOTE — Discharge Instructions (Signed)

## 2015-11-16 NOTE — Anesthesia Postprocedure Evaluation (Signed)
Anesthesia Post Note  Patient: Leslie [name]  Procedure(s) Performed: Procedure(s) (LRB): CATARACT EXTRACTION PHACO AND INTRAOCULAR LENS PLACEMENT RIGHT EYE; CDE:  8.65 (Right)  Patient location during evaluation: Short Stay Anesthesia Type: MAC Level of consciousness: awake and alert and oriented Pain management: pain level controlled Vital Signs Assessment: post-procedure vital signs reviewed and stable Respiratory status: spontaneous breathing Cardiovascular status: stable Postop Assessment: no signs of nausea or vomiting Anesthetic complications: no    Last Vitals:  Vitals:   11/16/15 1105 11/16/15 1110  BP:    Resp: 12 19  Temp:      Last Pain:  Vitals:   11/16/15 0956  TempSrc: Oral                 ADAMS, AMY A

## 2015-11-16 NOTE — H&P (Signed)
I have reviewed the H&P, the patient was re-examined, and I have identified no interval changes in medical condition and plan of care since the history and physical of record  

## 2015-11-16 NOTE — Anesthesia Preprocedure Evaluation (Signed)
Anesthesia Evaluation  Patient identified by MRN, date of birth, ID band Patient awake    Reviewed: Allergy & Precautions, H&P , NPO status , Patient's Chart, lab work & pertinent test results, reviewed documented beta blocker date and time   History of Anesthesia Complications (+) PONV and history of anesthetic complications  Airway Mallampati: II  TM Distance: >3 FB Neck ROM: Full    Dental  (+) Teeth Intact, Dental Advisory Given   Pulmonary asthma , pneumonia, former smoker,    breath sounds clear to auscultation       Cardiovascular hypertension, Pt. on medications and Pt. on home beta blockers  Rhythm:Regular Rate:Normal     Neuro/Psych negative neurological ROS  negative psych ROS   GI/Hepatic Neg liver ROS, GERD  Medicated and Controlled,  Endo/Other  diabetes, Type 2, Oral Hypoglycemic Agents  Renal/GU negative Renal ROS     Musculoskeletal  (+) Arthritis , Osteoarthritis,    Abdominal (+) + obese,   Peds negative pediatric ROS (+)  Hematology negative hematology ROS (+)   Anesthesia Other Findings   Reproductive/Obstetrics                             Anesthesia Physical Anesthesia Plan  ASA: III  Anesthesia Plan: MAC   Post-op Pain Management:    Induction: Intravenous  Airway Management Planned: Nasal Cannula  Additional Equipment:   Intra-op Plan:   Post-operative Plan:   Informed Consent: I have reviewed the patients History and Physical, chart, labs and discussed the procedure including the risks, benefits and alternatives for the proposed anesthesia with the patient or authorized representative who has indicated his/her understanding and acceptance.     Plan Discussed with:   Anesthesia Plan Comments:         Anesthesia Quick Evaluation

## 2015-11-19 ENCOUNTER — Encounter (HOSPITAL_COMMUNITY): Payer: Self-pay | Admitting: Ophthalmology

## 2016-02-04 ENCOUNTER — Other Ambulatory Visit: Payer: Self-pay | Admitting: Internal Medicine

## 2016-02-04 DIAGNOSIS — R921 Mammographic calcification found on diagnostic imaging of breast: Secondary | ICD-10-CM

## 2016-02-10 ENCOUNTER — Ambulatory Visit
Admission: RE | Admit: 2016-02-10 | Discharge: 2016-02-10 | Disposition: A | Discharge status: Home / Self Care | Payer: 59 | Source: Ambulatory Visit | Attending: Internal Medicine | Admitting: Internal Medicine

## 2016-02-10 DIAGNOSIS — R921 Mammographic calcification found on diagnostic imaging of breast: Secondary | ICD-10-CM

## 2017-01-19 ENCOUNTER — Other Ambulatory Visit: Payer: Self-pay | Admitting: Internal Medicine

## 2017-01-19 DIAGNOSIS — Z1231 Encounter for screening mammogram for malignant neoplasm of breast: Secondary | ICD-10-CM

## 2017-02-10 ENCOUNTER — Ambulatory Visit
Admission: RE | Admit: 2017-02-10 | Discharge: 2017-02-10 | Disposition: A | Discharge status: Home / Self Care | Payer: 59 | Source: Ambulatory Visit | Attending: Internal Medicine | Admitting: Internal Medicine

## 2017-02-10 DIAGNOSIS — Z1231 Encounter for screening mammogram for malignant neoplasm of breast: Secondary | ICD-10-CM

## 2018-01-25 ENCOUNTER — Other Ambulatory Visit: Payer: Self-pay | Admitting: Internal Medicine

## 2018-01-25 DIAGNOSIS — Z1231 Encounter for screening mammogram for malignant neoplasm of breast: Secondary | ICD-10-CM

## 2018-03-15 ENCOUNTER — Ambulatory Visit
Admission: RE | Admit: 2018-03-15 | Discharge: 2018-03-15 | Disposition: A | Discharge status: Home / Self Care | Payer: 59 | Source: Ambulatory Visit | Attending: Internal Medicine | Admitting: Internal Medicine

## 2018-03-15 DIAGNOSIS — Z1231 Encounter for screening mammogram for malignant neoplasm of breast: Secondary | ICD-10-CM

## 2019-02-27 ENCOUNTER — Other Ambulatory Visit: Payer: Self-pay | Admitting: Internal Medicine

## 2019-02-27 DIAGNOSIS — Z1231 Encounter for screening mammogram for malignant neoplasm of breast: Secondary | ICD-10-CM

## 2019-04-01 ENCOUNTER — Ambulatory Visit: Payer: 59

## 2019-05-10 ENCOUNTER — Other Ambulatory Visit: Payer: Self-pay

## 2019-05-10 ENCOUNTER — Ambulatory Visit
Admission: RE | Admit: 2019-05-10 | Discharge: 2019-05-10 | Disposition: A | Discharge status: Home / Self Care | Payer: 59 | Source: Ambulatory Visit | Attending: Internal Medicine | Admitting: Internal Medicine

## 2019-05-10 DIAGNOSIS — Z1231 Encounter for screening mammogram for malignant neoplasm of breast: Secondary | ICD-10-CM

## 2021-08-03 ENCOUNTER — Encounter: Payer: Self-pay | Admitting: Pulmonary Disease

## 2021-08-03 ENCOUNTER — Ambulatory Visit: Payer: Medicare Other | Admitting: Pulmonary Disease

## 2021-08-03 DIAGNOSIS — J4520 Mild intermittent asthma, uncomplicated: Secondary | ICD-10-CM | POA: Diagnosis not present

## 2021-08-03 DIAGNOSIS — J45909 Unspecified asthma, uncomplicated: Secondary | ICD-10-CM | POA: Insufficient documentation

## 2021-08-03 DIAGNOSIS — G4733 Obstructive sleep apnea (adult) (pediatric): Secondary | ICD-10-CM

## 2021-08-03 NOTE — Assessment & Plan Note (Addendum)
Unclear what caused flareup over the last 6 months.  Detailed environmental/ medical history obtained and no trigger identified.  She does not seem to have overt GERD, may have postnasal drip, does not appear to be seasonal. ?She will revert to using albuterol on an as-needed basis.  If symptoms recur we will reinstated steroid/LABA combination such as Symbicort which she has used in the past. ?Advair caused a reaction and she is hesitant to use ? ?Check spirometry pre and post ?

## 2021-08-03 NOTE — Patient Instructions (Signed)
?  X spirometry -pre/post ? ?X home sleep test ?

## 2021-08-03 NOTE — Progress Notes (Signed)
? ?Subjective:  ? ? Patient ID: Leslie [name], female    DOB: Jul 28, 1955, 66 y.o.   MRN: [id number] ? ?HPI ? ?66 year old remote smoker presents for evaluation of "asthma" and sleep disordered breathing. ?She reports a remote history of "bronchial asthma".  For years she only needed albuterol about twice a year.  For the past 6 months she had increased frequency of albuterol use starting with 2-3 nights a week and then even in the daytime.  She developed wheezing and a dry cough like a tickle in her throat.  She was seen by PCP in March 2023 and given a short course of prednisone and samples of Trelegy.  She took this for 2 weeks and felt significantly improved back to her baseline.  She has been off these medications now for about 6 weeks and symptoms have not recurred. ? ?She reports nocturnal awakenings and nonrefreshing sleep.  Epworth sleepiness score is 7 and she reports sleepiness while lying down to rest in the afternoon or watching TV. ?Bedtime is between 11 PM and 1 AM, sleep latency is minimal, she sleeps on her side with 2 pillows, reports 3-4 nocturnal awakenings including nocturia and then has an increased postvoid sleep latency, finally out of bed at 8 AM feeling achy all over with dryness of mouth but denies headaches. ?She has lost 15 pounds in the last 10 years. ?She reports having a home sleep test about 12 years ago and was told that she had "borderline" OSA but never required treatment ?There is no history suggestive of cataplexy, sleep paralysis or parasomnias ? ?PMH -hypertension requiring 3 medications, diabetes, hyperlipidemia ? ?I have reviewed PCP notes ? ? ? ? ? ?Past Medical History:  ?Diagnosis Date  ? Arthritis   ? Asthma   ? Cancer [facility])   ? skin cancer  ? Diabetes mellitus without complication ([place])   ? Type 2 NIDDM x 3 years  ? GERD (gastroesophageal reflux disease)   ? Hernia, ventral   ? History of kidney stones   ? Hyperlipidemia   ? Hypertension   ? on meds for 2 years  ? PONV  (postoperative nausea and vomiting)   ? ? ?Past Surgical History:  ?Procedure Laterality Date  ? ABDOMINAL HYSTERECTOMY  2001  ? BREAST BIOPSY Left   ? CATARACT EXTRACTION Left   ? CATARACT EXTRACTION W/PHACO Right 11/16/2015  ? Procedure: CATARACT EXTRACTION PHACO AND INTRAOCULAR LENS PLACEMENT RIGHT EYE; CDE:  8.65;  Surgeon: Tonny Branch, MD;  Location: [facility];  Service: Ophthalmology;  Laterality: Right;  ? CHOLECYSTECTOMY  1991  ? HERNIA REPAIR  2002  ? inscisional hernia x4  ? INCISION AND DRAINAGE ABSCESS N/A 07/02/2012  ? Procedure: INCISION AND DRAINAGE ABDOMINAL WALL ABSCESS;  Surgeon: Gwenyth Ober, MD;  Location: [place];  Service: General;  Laterality: N/A;  ? kidney stones  2000  ? cystoscopy with stone removal  ? TONSILLECTOMY  1967  ? ?Allergies  ?Allergen Reactions  ? Hydrochlorothiazide Other (See Comments)  ?  Caused High sugars  ? Morphine And Related Nausea And Vomiting  ? Tape Other (See Comments)  ?  And bandaids also, causes skin rawness and tears  ? Lisinopril Cough  ? ? ?Social History  ? ?Socioeconomic History  ? Marital status: Married  ?  Spouse name: Not on file  ? Number of children: Not on file  ? Years of education: Not on file  ? Highest education level: Not on file  ?Occupational History  ?  Not on file  ?Tobacco Use  ? Smoking status: Former  ?  Packs/day: 1.00  ?  Years: 4.00  ?  Pack years: 4.00  ?  Types: Cigarettes  ?  Quit date: 11/12/1967  ?  Years since quitting: 53.7  ? Smokeless tobacco: Never  ? Tobacco comments:  ?  quit smoking 36 years ago  ?Substance and Sexual Activity  ? Alcohol use: Yes  ?  Comment: rare; 3 x year  ? Drug use: No  ? Sexual activity: Yes  ?  Birth control/protection: Surgical  ?Other Topics Concern  ? Not on file  ?Social History Narrative  ? Not on file  ? ?Social Determinants of Health  ? ?Financial Resource Strain: Not on file  ?Food Insecurity: Not on file  ?Transportation Needs: Not on file  ?Physical Activity: Not on file  ?Stress: Not on file   ?Social Connections: Not on file  ?Intimate Partner Violence: Not on file  ? ? ?Family History  ?Problem Relation Age of Onset  ? Hypertension Mother   ? Alzheimer's disease Mother   ? ? ? ? ?Review of Systems ?Constitutional: negative for anorexia, fevers and sweats  ?Eyes: negative for irritation, redness and visual disturbance  ?Ears, nose, mouth, throat, and face: negative for earaches, epistaxis, nasal congestion and sore throat  ?Respiratory: negative for cough, dyspnea on exertion, sputum and wheezing  ?Cardiovascular: negative for chest pain, dyspnea, lower extremity edema, orthopnea, palpitations and syncope  ?Gastrointestinal: negative for abdominal pain, constipation, diarrhea, melena, nausea and vomiting  ?Genitourinary:negative for dysuria, frequency and hematuria  ?Hematologic/lymphatic: negative for bleeding, easy bruising and lymphadenopathy  ?Musculoskeletal:negative for arthralgias, muscle weakness and stiff joints  ?Neurological: negative for coordination problems, gait problems, headaches and weakness  ?Endocrine: negative for diabetic symptoms including polydipsia, polyuria and weight loss ? ?   ?Objective:  ? Physical Exam ? ?Gen. Pleasant, obese, in no distress, normal affect ?ENT - no pallor,icterus, no post nasal drip, class 2-3 airway ?Neck: No JVD, no thyromegaly, no carotid bruits ?Lungs: no use of accessory muscles, no dullness to percussion, decreased without rales or rhonchi  ?Cardiovascular: Rhythm regular, heart sounds  normal, no murmurs or gallops, no peripheral edema ?Abdomen: soft and non-tender, no hepatosplenomegaly, BS normal. ?Musculoskeletal: No deformities, no cyanosis or clubbing ?Neuro:  alert, non focal, no tremors ? ? ? ?   ?Assessment & Plan:  ? ? ?

## 2021-08-03 NOTE — Addendum Note (Signed)
Addended by: Fritzi Mandes D on: 08/03/2021 09:22 AM ? ? Modules accepted: Orders ? ?

## 2021-08-03 NOTE — Assessment & Plan Note (Signed)
Intermediate pretest probability ? ?Given excessive daytime somnolence, narrow pharyngeal exam, witnessed apneas & loud snoring, obstructive sleep apnea is very likely & an overnight polysomnogram will be scheduled as a home study. The pathophysiology of obstructive sleep apnea , it's cardiovascular consequences & modes of treatment including CPAP were discused with the patient in detail & they evidenced understanding. ? ?Her husband has severe OSA and sleeps with a BiPAP.  She would be willing to use if needed ? ?

## 2021-08-16 ENCOUNTER — Institutional Professional Consult (permissible substitution): Payer: Self-pay | Admitting: Internal Medicine

## 2021-11-26 ENCOUNTER — Ambulatory Visit: Payer: Medicare Other | Admitting: Pulmonary Disease

## 2021-12-23 ENCOUNTER — Encounter: Payer: Self-pay | Admitting: Pulmonary Disease

## 2021-12-23 ENCOUNTER — Ambulatory Visit (HOSPITAL_COMMUNITY)
Admission: RE | Admit: 2021-12-23 | Discharge: 2021-12-23 | Disposition: A | Discharge status: Home / Self Care | Payer: Medicare Other | Source: Ambulatory Visit | Attending: Pulmonary Disease | Admitting: Pulmonary Disease

## 2021-12-23 ENCOUNTER — Ambulatory Visit: Payer: Medicare Other | Admitting: Pulmonary Disease

## 2021-12-23 DIAGNOSIS — J4531 Mild persistent asthma with (acute) exacerbation: Secondary | ICD-10-CM

## 2021-12-23 DIAGNOSIS — G4733 Obstructive sleep apnea (adult) (pediatric): Secondary | ICD-10-CM

## 2021-12-23 MED ORDER — FLUTICASONE FUROATE-VILANTEROL 100-25 MCG/ACT IN AEPB: 1.0000 | INHALATION_SPRAY | Freq: Every day | RESPIRATORY_TRACT | 0 refills | Status: DC

## 2021-12-23 MED ORDER — PREDNISONE 10 MG PO TABS: ORAL_TABLET | ORAL | 0 refills | Status: AC

## 2021-12-23 NOTE — Assessment & Plan Note (Signed)
Appears to be persistent symptoms now, could be lingering postviral cough and bronchospasm related to COVID. Obtain chest x-ray today. We will treat with a short course of prednisone 20 mg for 5 days and then 10 mg for 5 days. I have asked her to finish using the sample of Trelegy that she has and we will send in prescription for Breo to use daily for 2 to 3 weeks and then on an as-needed basis. Spirometry will be obtained and if she has residual airway obstruction then I may have her use inhaled steroid for a longer time up to 3 months

## 2021-12-23 NOTE — Assessment & Plan Note (Signed)
Home sleep test will be scheduled

## 2021-12-23 NOTE — Progress Notes (Signed)
   Subjective:    Patient ID: Leslie [name], female    DOB: April 11, 1955, 66 y.o.   MRN: [id number]  HPI 66 year old for follow-up of mild intermittent asthma and OSA  PMH -hypertension requiring 3 medications, diabetes, hyperlipidemia  Initial office visit 08/2021 >Unclear what caused flareup over the last 6 months. no trigger identified.  She does not seem to have overt GERD, may have postnasal drip, does not appear to be seasonal. She will revert to using albuterol on an as-needed basis.   4 month FU visit Covid in aug , she needed increased usage of albuterol, still has a lingering cough, has tried Delsym OTC and Best boy.  Cough is persistent, occasionally has a tickle in her throat in the daytime and then paroxysms of cough.  Does not want narcotic cough syrup. She put breathing test and sleep test on hold when she got COVID.  She has them scheduled now She had samples of Trelegy leftover given by her PCP and she used this and this seems to provide relief for up to 48 hours.  She is using albuterol almost every 6 hours   Significant tests/ events reviewed   Review of Systems neg for any significant sore throat, dysphagia, itching, sneezing, nasal congestion or excess/ purulent secretions, fever, chills, sweats, unintended wt loss, pleuritic or exertional cp, hempoptysis, orthopnea pnd or change in chronic leg swelling. Also denies presyncope, palpitations, heartburn, abdominal pain, nausea, vomiting, diarrhea or change in bowel or urinary habits, dysuria,hematuria, rash, arthralgias, visual complaints, headache, numbness weakness or ataxia.     Objective:   Physical Exam   Gen. Pleasant, obese, in no distress ENT - no lesions, no post nasal drip Neck: No JVD, no thyromegaly, no carotid bruits Lungs: no use of accessory muscles, no dullness to percussion, decreased without rales , faint scattered rhonchi  Cardiovascular: Rhythm regular, heart sounds  normal, no murmurs or  gallops, no peripheral edema Musculoskeletal: No deformities, no cyanosis or clubbing , no tremors        Assessment & Plan:

## 2021-12-23 NOTE — Patient Instructions (Addendum)
  X Rx for Breo 100 once daily x 2 weeks  X CXR   X Prednisone 10 mg tabs  Take 2 tabs daily with food x 5ds, then 1 tab daily with food x 5ds then STOP  Await spiormetry & sleep test results

## 2022-01-12 ENCOUNTER — Ambulatory Visit (INDEPENDENT_AMBULATORY_CARE_PROVIDER_SITE_OTHER): Payer: Medicare Other

## 2022-01-12 DIAGNOSIS — G4733 Obstructive sleep apnea (adult) (pediatric): Secondary | ICD-10-CM

## 2022-01-20 ENCOUNTER — Telehealth: Payer: Self-pay | Admitting: Pulmonary Disease

## 2022-01-20 NOTE — Telephone Encounter (Signed)
She tried 3 times but no recording is more than an hour Please repeat sleep study

## 2022-01-25 ENCOUNTER — Ambulatory Visit (HOSPITAL_COMMUNITY)
Admission: RE | Admit: 2022-01-25 | Discharge: 2022-01-25 | Disposition: A | Discharge status: Home / Self Care | Payer: Medicare Other | Source: Ambulatory Visit | Attending: Pulmonary Disease | Admitting: Pulmonary Disease

## 2022-01-25 DIAGNOSIS — J4520 Mild intermittent asthma, uncomplicated: Secondary | ICD-10-CM | POA: Diagnosis present

## 2022-01-25 LAB — PULMONARY FUNCTION TEST
DL/VA % pred: 110 %
DL/VA: 4.66 ml/min/mmHg/L
DLCO unc % pred: 78 %
DLCO unc: 14.43 ml/min/mmHg
FEF 25-75 Post: 0.8 L/sec
FEF 25-75 Pre: 0.88 L/sec
FEF2575-%Change-Post: -9 %
FEF2575-%Pred-Post: 40 %
FEF2575-%Pred-Pre: 44 %
FEV1-%Change-Post: 0 %
FEV1-%Pred-Post: 59 %
FEV1-%Pred-Pre: 59 %
FEV1-Post: 1.31 L
FEV1-Pre: 1.32 L
FEV1FVC-%Change-Post: -7 %
FEV1FVC-%Pred-Pre: 96 %
FEV6-%Change-Post: 6 %
FEV6-%Pred-Post: 67 %
FEV6-%Pred-Pre: 62 %
FEV6-Post: 1.86 L
FEV6-Pre: 1.74 L
FEV6FVC-%Change-Post: 0 %
FEV6FVC-%Pred-Post: 102 %
FEV6FVC-%Pred-Pre: 102 %
FVC-%Change-Post: 6 %
FVC-%Pred-Post: 65 %
FVC-%Pred-Pre: 61 %
FVC-Post: 1.9 L
FVC-Pre: 1.78 L
Post FEV1/FVC ratio: 69 %
Post FEV6/FVC ratio: 98 %
Pre FEV1/FVC ratio: 74 %
Pre FEV6/FVC Ratio: 98 %
RV % pred: 167 %
RV: 3.36 L
TLC % pred: 113 %
TLC: 5.4 L

## 2022-01-25 MED ORDER — ALBUTEROL SULFATE (2.5 MG/3ML) 0.083% IN NEBU
2.5000 mg | INHALATION_SOLUTION | Freq: Once | RESPIRATORY_TRACT | Status: AC
  Administered 2022-01-25: 2.5 mg via RESPIRATORY_TRACT

## 2022-03-01 ENCOUNTER — Other Ambulatory Visit: Payer: Self-pay | Admitting: Internal Medicine

## 2022-03-01 DIAGNOSIS — Z1231 Encounter for screening mammogram for malignant neoplasm of breast: Secondary | ICD-10-CM

## 2022-03-11 ENCOUNTER — Telehealth: Payer: Self-pay | Admitting: Pulmonary Disease

## 2022-03-11 DIAGNOSIS — G4733 Obstructive sleep apnea (adult) (pediatric): Secondary | ICD-10-CM

## 2022-03-11 NOTE — Telephone Encounter (Signed)
ATC patient. Left a detailed vm. Ok per dpr. Letting patient know we are ordering an in lab study. Advised her to call back for questions.

## 2022-03-11 NOTE — Telephone Encounter (Signed)
She has 2 entries for 11/1 - one study is 5 mins & another is 30 mins.   This patient will need an in-lab study. We have tried too many times with home testing

## 2022-03-23 ENCOUNTER — Inpatient Hospital Stay: Admission: RE | Admit: 2022-03-23 | Payer: Medicare Other | Source: Ambulatory Visit

## 2022-04-15 ENCOUNTER — Encounter: Payer: Medicare Other | Admitting: Pulmonary Disease

## 2022-05-27 ENCOUNTER — Encounter: Payer: Self-pay | Admitting: Pulmonary Disease

## 2022-06-27 ENCOUNTER — Ambulatory Visit: Payer: Medicare HMO | Attending: Pulmonary Disease | Admitting: Pulmonary Disease

## 2022-06-27 DIAGNOSIS — R0683 Snoring: Secondary | ICD-10-CM | POA: Insufficient documentation

## 2022-06-27 DIAGNOSIS — I493 Ventricular premature depolarization: Secondary | ICD-10-CM | POA: Diagnosis not present

## 2022-06-27 DIAGNOSIS — R400 Somnolence: Secondary | ICD-10-CM | POA: Diagnosis not present

## 2022-06-27 DIAGNOSIS — G4733 Obstructive sleep apnea (adult) (pediatric): Secondary | ICD-10-CM

## 2022-07-04 ENCOUNTER — Telehealth: Payer: Self-pay | Admitting: Pulmonary Disease

## 2022-07-04 DIAGNOSIS — R0683 Snoring: Secondary | ICD-10-CM

## 2022-07-04 DIAGNOSIS — G4733 Obstructive sleep apnea (adult) (pediatric): Secondary | ICD-10-CM | POA: Diagnosis not present

## 2022-07-04 NOTE — Procedures (Signed)
Patient Name: Leslie, [name] Date: 06/27/2022 Gender: Female D.O.B: November 29, 1955 Age (years): 31 Referring Provider: Kara Mead MD, ABSM Height (inches): 63 Interpreting Physician: Kara Mead MD, ABSM Weight (lbs): 210 RPSGT: Rosebud Poles BMI: 37 MRN: [id number] Neck Size: 17.00 <br> <br> CLINICAL INFORMATION Sleep Study Type: NPSG    Indication for sleep study: snoring ,daytime somnolence    Epworth Sleepiness Score: 3    SLEEP STUDY TECHNIQUE As per the AASM Manual for the Scoring of Sleep and Associated Events v2.3 (April 2016) with a hypopnea requiring 4% desaturations.  The channels recorded and monitored were frontal, central and occipital EEG, electrooculogram (EOG), submentalis EMG (chin), nasal and oral airflow, thoracic and abdominal wall motion, anterior tibialis EMG, snore microphone, electrocardiogram, and pulse oximetry.  MEDICATIONS Medications self-administered by patient taken the night of the study : N/A  SLEEP ARCHITECTURE The study was initiated at 10:54:43 PM and ended at 5:20:45 AM.  Sleep onset time was 17.2 minutes and the sleep efficiency was 48.2%. The total sleep time was 186 minutes.  Stage REM latency was 86.0 minutes.  The patient spent 5.38% of the night in stage N1 sleep, 50.00% in stage N2 sleep, 37.10% in stage N3 and 7.5% in REM.  Alpha intrusion was absent.  Supine sleep was 0.00%.  RESPIRATORY PARAMETERS The overall apnea/hypopnea index (AHI) was 2.9 per hour. There were 0 total apneas, including 0 obstructive, 0 central and 0 mixed apneas. There were 9 hypopneas and 3 RERAs.  The AHI during Stage REM sleep was 34.3 per hour.  AHI while supine was N/A per hour.  The mean oxygen saturation was 94.73%. The minimum SpO2 during sleep was 90.00%.  moderate snoring was noted during this study.  CARDIAC DATA The 2 lead EKG demonstrated sinus rhythm. The mean heart rate was 40.82 beats per minute. Other EKG findings  include: PVCs.  LEG MOVEMENT DATA The total PLMS were 0 with a resulting PLMS index of 0.00. Associated arousal with leg movement index was 0.0 .  IMPRESSIONS - No significant obstructive sleep apnea occurred during this study (AHI = 2.9/h). No supine sleep was noted & TST was only 3 hours - The patient had minimal or no oxygen desaturation during the study (Min O2 = 90.00%) - The patient snored with moderate snoring volume. - EKG findings include PVCs. - Clinically significant periodic limb movements did not occur during sleep. No significant associated arousals.   DIAGNOSIS - No significant sleep disordered study   RECOMMENDATIONS - Sub optimal study - consider retesting if clinical probability is high - Avoid alcohol, sedatives and other CNS depressants that may worsen sleep apnea and disrupt normal sleep architecture. - Sleep hygiene should be reviewed to assess factors that may improve sleep quality. - Weight management and regular exercise should be initiated or continued if appropriate.   Kara Mead MD Board Certified in [place]

## 2022-07-04 NOTE — Telephone Encounter (Signed)
Sleep study did not show significant sleep apnea. Study was limited because she only slept for 3 hours and did not sleep on her back. At this time we will hold off on further testing.  She should avoid sleeping on her back We can arrange routine follow-up visit-next available

## 2022-07-05 NOTE — Telephone Encounter (Signed)
ATC, mychart message sent to patient.

## 2022-09-13 DIAGNOSIS — Z0000 Encounter for general adult medical examination without abnormal findings: Secondary | ICD-10-CM | POA: Diagnosis not present

## 2022-09-13 DIAGNOSIS — Z1331 Encounter for screening for depression: Secondary | ICD-10-CM | POA: Diagnosis not present

## 2022-09-13 DIAGNOSIS — Z87891 Personal history of nicotine dependence: Secondary | ICD-10-CM | POA: Diagnosis not present

## 2022-09-13 DIAGNOSIS — I10 Essential (primary) hypertension: Secondary | ICD-10-CM | POA: Diagnosis not present

## 2022-09-13 DIAGNOSIS — E1159 Type 2 diabetes mellitus with other circulatory complications: Secondary | ICD-10-CM | POA: Diagnosis not present

## 2022-09-13 DIAGNOSIS — Z6838 Body mass index (BMI) 38.0-38.9, adult: Secondary | ICD-10-CM | POA: Diagnosis not present

## 2022-09-13 DIAGNOSIS — I152 Hypertension secondary to endocrine disorders: Secondary | ICD-10-CM | POA: Diagnosis not present

## 2022-09-13 DIAGNOSIS — Z299 Encounter for prophylactic measures, unspecified: Secondary | ICD-10-CM | POA: Diagnosis not present

## 2022-09-13 DIAGNOSIS — Z7189 Other specified counseling: Secondary | ICD-10-CM | POA: Diagnosis not present

## 2022-09-13 DIAGNOSIS — R5383 Other fatigue: Secondary | ICD-10-CM | POA: Diagnosis not present

## 2022-09-13 DIAGNOSIS — Z1339 Encounter for screening examination for other mental health and behavioral disorders: Secondary | ICD-10-CM | POA: Diagnosis not present

## 2022-09-14 DIAGNOSIS — E7800 Pure hypercholesterolemia, unspecified: Secondary | ICD-10-CM | POA: Diagnosis not present

## 2022-09-14 DIAGNOSIS — Z79899 Other long term (current) drug therapy: Secondary | ICD-10-CM | POA: Diagnosis not present

## 2022-09-14 DIAGNOSIS — R5383 Other fatigue: Secondary | ICD-10-CM | POA: Diagnosis not present

## 2022-11-09 DIAGNOSIS — I10 Essential (primary) hypertension: Secondary | ICD-10-CM | POA: Diagnosis not present

## 2022-11-09 DIAGNOSIS — Z299 Encounter for prophylactic measures, unspecified: Secondary | ICD-10-CM | POA: Diagnosis not present

## 2022-11-09 DIAGNOSIS — E1165 Type 2 diabetes mellitus with hyperglycemia: Secondary | ICD-10-CM | POA: Diagnosis not present

## 2022-11-09 DIAGNOSIS — J449 Chronic obstructive pulmonary disease, unspecified: Secondary | ICD-10-CM | POA: Diagnosis not present

## 2022-11-09 DIAGNOSIS — E1159 Type 2 diabetes mellitus with other circulatory complications: Secondary | ICD-10-CM | POA: Diagnosis not present

## 2022-11-09 DIAGNOSIS — I152 Hypertension secondary to endocrine disorders: Secondary | ICD-10-CM | POA: Diagnosis not present

## 2022-12-29 DIAGNOSIS — E119 Type 2 diabetes mellitus without complications: Secondary | ICD-10-CM | POA: Diagnosis not present

## 2022-12-29 DIAGNOSIS — Z7984 Long term (current) use of oral hypoglycemic drugs: Secondary | ICD-10-CM | POA: Diagnosis not present

## 2022-12-29 DIAGNOSIS — H26493 Other secondary cataract, bilateral: Secondary | ICD-10-CM | POA: Diagnosis not present

## 2022-12-29 DIAGNOSIS — Z961 Presence of intraocular lens: Secondary | ICD-10-CM | POA: Diagnosis not present

## 2022-12-30 ENCOUNTER — Inpatient Hospital Stay: Admission: RE | Admit: 2022-12-30 | Payer: Medicare HMO | Source: Ambulatory Visit

## 2023-01-27 ENCOUNTER — Emergency Department (HOSPITAL_COMMUNITY): Payer: Medicare HMO

## 2023-01-27 ENCOUNTER — Encounter (HOSPITAL_COMMUNITY): Payer: Self-pay

## 2023-01-27 ENCOUNTER — Other Ambulatory Visit: Payer: Self-pay

## 2023-01-27 ENCOUNTER — Inpatient Hospital Stay (HOSPITAL_COMMUNITY)
Admission: EM | Admit: 2023-01-27 | Discharge: 2023-01-31 | DRG: 321 | Disposition: A | Discharge status: Home / Self Care | Payer: Medicare HMO | Attending: Internal Medicine | Admitting: Internal Medicine

## 2023-01-27 DIAGNOSIS — E66812 Obesity, class 2: Secondary | ICD-10-CM | POA: Diagnosis present

## 2023-01-27 DIAGNOSIS — Z955 Presence of coronary angioplasty implant and graft: Secondary | ICD-10-CM

## 2023-01-27 DIAGNOSIS — Z820 Family history of epilepsy and other diseases of the nervous system: Secondary | ICD-10-CM

## 2023-01-27 DIAGNOSIS — Z87891 Personal history of nicotine dependence: Secondary | ICD-10-CM

## 2023-01-27 DIAGNOSIS — Z23 Encounter for immunization: Secondary | ICD-10-CM

## 2023-01-27 DIAGNOSIS — Z7982 Long term (current) use of aspirin: Secondary | ICD-10-CM

## 2023-01-27 DIAGNOSIS — Z888 Allergy status to other drugs, medicaments and biological substances status: Secondary | ICD-10-CM

## 2023-01-27 DIAGNOSIS — Z9049 Acquired absence of other specified parts of digestive tract: Secondary | ICD-10-CM

## 2023-01-27 DIAGNOSIS — K219 Gastro-esophageal reflux disease without esophagitis: Secondary | ICD-10-CM

## 2023-01-27 DIAGNOSIS — E119 Type 2 diabetes mellitus without complications: Secondary | ICD-10-CM

## 2023-01-27 DIAGNOSIS — I519 Heart disease, unspecified: Secondary | ICD-10-CM

## 2023-01-27 DIAGNOSIS — I447 Left bundle-branch block, unspecified: Secondary | ICD-10-CM | POA: Diagnosis present

## 2023-01-27 DIAGNOSIS — I255 Ischemic cardiomyopathy: Secondary | ICD-10-CM

## 2023-01-27 DIAGNOSIS — Z87442 Personal history of urinary calculi: Secondary | ICD-10-CM

## 2023-01-27 DIAGNOSIS — I5021 Acute systolic (congestive) heart failure: Secondary | ICD-10-CM | POA: Diagnosis not present

## 2023-01-27 DIAGNOSIS — Z91048 Other nonmedicinal substance allergy status: Secondary | ICD-10-CM

## 2023-01-27 DIAGNOSIS — I2510 Atherosclerotic heart disease of native coronary artery without angina pectoris: Secondary | ICD-10-CM | POA: Diagnosis present

## 2023-01-27 DIAGNOSIS — J45909 Unspecified asthma, uncomplicated: Secondary | ICD-10-CM | POA: Diagnosis present

## 2023-01-27 DIAGNOSIS — R918 Other nonspecific abnormal finding of lung field: Secondary | ICD-10-CM | POA: Diagnosis not present

## 2023-01-27 DIAGNOSIS — I214 Non-ST elevation (NSTEMI) myocardial infarction: Secondary | ICD-10-CM | POA: Diagnosis not present

## 2023-01-27 DIAGNOSIS — E785 Hyperlipidemia, unspecified: Secondary | ICD-10-CM

## 2023-01-27 DIAGNOSIS — E669 Obesity, unspecified: Secondary | ICD-10-CM

## 2023-01-27 DIAGNOSIS — R079 Chest pain, unspecified: Principal | ICD-10-CM | POA: Diagnosis present

## 2023-01-27 DIAGNOSIS — Z8249 Family history of ischemic heart disease and other diseases of the circulatory system: Secondary | ICD-10-CM

## 2023-01-27 DIAGNOSIS — Z6838 Body mass index (BMI) 38.0-38.9, adult: Secondary | ICD-10-CM

## 2023-01-27 DIAGNOSIS — D72829 Elevated white blood cell count, unspecified: Secondary | ICD-10-CM | POA: Diagnosis present

## 2023-01-27 DIAGNOSIS — Z7984 Long term (current) use of oral hypoglycemic drugs: Secondary | ICD-10-CM

## 2023-01-27 DIAGNOSIS — Z885 Allergy status to narcotic agent status: Secondary | ICD-10-CM

## 2023-01-27 DIAGNOSIS — R0789 Other chest pain: Secondary | ICD-10-CM | POA: Diagnosis not present

## 2023-01-27 DIAGNOSIS — I110 Hypertensive heart disease with heart failure: Secondary | ICD-10-CM | POA: Diagnosis present

## 2023-01-27 DIAGNOSIS — Z90710 Acquired absence of both cervix and uterus: Secondary | ICD-10-CM

## 2023-01-27 DIAGNOSIS — Z85828 Personal history of other malignant neoplasm of skin: Secondary | ICD-10-CM

## 2023-01-27 DIAGNOSIS — Z79899 Other long term (current) drug therapy: Secondary | ICD-10-CM

## 2023-01-27 DIAGNOSIS — M1990 Unspecified osteoarthritis, unspecified site: Secondary | ICD-10-CM | POA: Diagnosis present

## 2023-01-27 DIAGNOSIS — I10 Essential (primary) hypertension: Secondary | ICD-10-CM

## 2023-01-27 DIAGNOSIS — I43 Cardiomyopathy in diseases classified elsewhere: Secondary | ICD-10-CM | POA: Diagnosis present

## 2023-01-27 LAB — BASIC METABOLIC PANEL
Anion gap: 12 (ref 5–15)
BUN: 10 mg/dL (ref 8–23)
CO2: 22 mmol/L (ref 22–32)
Calcium: 9.4 mg/dL (ref 8.9–10.3)
Chloride: 104 mmol/L (ref 98–111)
Creatinine, Ser: 0.85 mg/dL (ref 0.44–1.00)
GFR, Estimated: >60 mL/min (ref >60)
Glucose, Bld: 112 mg/dL — ABNORMAL HIGH (ref 70–99)
Potassium: 4.1 mmol/L (ref 3.5–5.1)
Sodium: 138 mmol/L (ref 135–145)

## 2023-01-27 LAB — CBC
HCT: 38.1 % (ref 36.0–46.0)
Hemoglobin: 12.1 g/dL (ref 12.0–15.0)
MCH: 27.3 pg (ref 26.0–34.0)
MCHC: 31.8 g/dL (ref 30.0–36.0)
MCV: 86 fL (ref 80.0–100.0)
Platelets: 313 10*3/uL (ref 150–400)
RBC: 4.43 MIL/uL (ref 3.87–5.11)
RDW: 16.1 % — ABNORMAL HIGH (ref 11.5–15.5)
WBC: 14 10*3/uL — ABNORMAL HIGH (ref 4.0–10.5)
nRBC: 0 % (ref 0.0–0.2)

## 2023-01-27 LAB — TROPONIN I (HIGH SENSITIVITY): Troponin I (High Sensitivity): 9 ng/L (ref <18)

## 2023-01-27 MED ORDER — ASPIRIN 81 MG PO CHEW
324.0000 mg | CHEWABLE_TABLET | ORAL | Status: AC
  Administered 2023-01-27: 324 mg via ORAL
  Filled 2023-01-27: qty 4

## 2023-01-27 NOTE — Discharge Instructions (Signed)
You were seen for your chest pain in the emergency department.   At home, please take Tylenol and aspirin for your pain.    Follow-up with your primary doctor in 2-3 days regarding your visit.  Cardiology will be calling you regarding an appointment within the next 72 hours.  You may contact them if you do not hear from them in that time using the information in this packet.  Return immediately to the emergency department if you experience any of the following: Worsening pain, difficulty breathing, unexplained vomiting or sweating, or any other concerning symptoms.    Thank you for visiting our Emergency Department. It was a pleasure taking care of you today.

## 2023-01-27 NOTE — ED Provider Notes (Signed)
[place] EMERGENCY DEPARTMENT AT [facility] Provider Note   CSN: [id number] Arrival date & time: 01/27/23  2016     History {Add pertinent medical, surgical, social history, OB history to HPI:1} Chief Complaint  Patient presents with   Chest Pain    Oaklyn Dansby is a 67 y.o. female.  67 year old female with a history of diabetes, hypertension, and hyperlipidemia who presents to the emergency department with chest discomfort.  This morning at 630 she was walking in the house when she started experiencing chest pressure.  Says that she felt it in her neck and in her left arm.  Says that it resolved after about 10 minutes and then came back several times and would go away.  Resolved soon as she got to the emergency department and does not currently have the pain.  Unsure if it was exertional.  Nauseous but no vomiting.  No diaphoresis.  No fever, runny nose, sore throat, cough, shortness of breath.  No history of MI.  No tobacco use.  Took some Aleve for the pain.       Home Medications Prior to Admission medications   Medication Sig Start Date End Date Taking? Authorizing Provider  albuterol (VENTOLIN HFA) 108 (90 Base) MCG/ACT inhaler SMARTSIG:1 Puff(s) By Mouth 4 Times Daily PRN 05/10/21   [provider]  amLODipine (NORVASC) 5 MG tablet Take 5 mg by mouth daily. 05/06/21   [provider]  aspirin EC 81 MG tablet Take 81 mg by mouth daily.    [provider]  candesartan (ATACAND) 16 MG tablet Take 16 mg by mouth daily. 05/06/21   [provider]  cetirizine (ZYRTEC) 10 MG tablet Take 10 mg by mouth daily.    [provider]  fluticasone furoate-vilanterol (BREO ELLIPTA) 100-25 MCG/ACT AEPB Inhale 1 puff into the lungs daily. One puff once daily for 2 weeks 12/23/21   Oretha Milch, MD  glipiZIDE (GLUCOTROL) 10 MG tablet Take 5-10 mg by mouth 2 (two) times daily before a meal. Take 10mg  in AM and 5mg  in PM    [provider]  JANUVIA 100 MG tablet Take 100 mg by mouth daily. 08/02/21   [provider]  metFORMIN (GLUCOPHAGE) 500 MG tablet Take 500-1,000 mg by mouth 2 (two) times daily with a meal. Take 2 tablets in AM and 1 tablet in PM    [provider]  metoprolol succinate (TOPROL-XL) 50 MG 24 hr tablet Take 25-50 mg by mouth daily. Take with or immediately following a meal.  Takes 25mg  in am and 50mg  in pm    [provider]  omeprazole (PRILOSEC) 20 MG capsule Take 20 mg by mouth daily.    [provider]  simvastatin (ZOCOR) 40 MG tablet Take 40 mg by mouth every evening.    [provider]      Allergies    Hydrochlorothiazide, Morphine and codeine, Tape, and Lisinopril    Review of Systems   Review of Systems  Physical Exam Updated Vital Signs BP (!) 155/67   Pulse 96   Temp 98.5 F (36.9 C) (Oral)   Resp (!) 22   Ht 5\' 3"  (1.6 m)   Wt 95.3 kg   SpO2 100%   BMI 37.20 kg/m  Physical Exam Vitals and nursing note reviewed.  Constitutional:      General: She is not in acute distress.    Appearance: She is well-developed.  HENT:     Head: Normocephalic  and atraumatic.     Right Ear: External ear normal.     Left Ear: External ear normal.     Nose: Nose normal.  Eyes:     Extraocular Movements: Extraocular movements intact.     Conjunctiva/sclera: Conjunctivae normal.     Pupils: Pupils are equal, round, and reactive to light.  Cardiovascular:     Rate and Rhythm: Normal rate and regular rhythm.     Heart sounds: No murmur heard.    Comments: Chest pain not reproducible Pulmonary:     Effort: Pulmonary effort is normal. No respiratory distress.     Breath sounds: Normal breath sounds.  Musculoskeletal:     Cervical back: Normal range of motion and neck supple.     Right lower leg: No edema.     Left lower leg: No edema.  Skin:    General: Skin is warm and dry.  Neurological:     Mental Status: She is alert and oriented to person,  place, and time. Mental status is at baseline.  Psychiatric:        Mood and Affect: Mood normal.    ***  ED Results / Procedures / Treatments   Labs (all labs ordered are listed, but only abnormal results are displayed) Labs Reviewed  BASIC METABOLIC PANEL  CBC  TROPONIN I (HIGH SENSITIVITY)    EKG EKG Interpretation Date/Time:  Friday January 27 2023 20:24:34 EDT Ventricular Rate:  94 PR Interval:  152 QRS Duration:  132 QT Interval:  374 QTC Calculation: 467 R Axis:   7  Text Interpretation: Normal sinus rhythm Non-specific intra-ventricular conduction block Nonspecific T wave abnormality Abnormal ECG When compared with ECG of 12-Nov-2015 08:27, Questionable change in QRS duration Minimal criteria for Anterior infarct are no longer Present Confirmed by Vonita Moss [phone]) on 01/27/2023 8:27:40 PM  Radiology No results found.  Procedures Procedures  {Document cardiac monitor, telemetry assessment procedure when appropriate:1}  Medications Ordered in ED Medications - No data to display  ED Course/ Medical Decision Making/ A&P   {   Click here for ABCD2, HEART and other calculatorsREFRESH Note before signing :1}                              Medical Decision Making Amount and/or Complexity of Data Reviewed Labs: ordered. Radiology: ordered.  Risk OTC drugs.   ***  {Document critical care time when appropriate:1} {Document review of labs and clinical decision tools ie heart score, Chads2Vasc2 etc:1}  {Document your independent review of radiology images, and any outside records:1} {Document your discussion with family members, caretakers, and with consultants:1} {Document social determinants of health affecting pt's care:1} {Document your decision making why or why not admission, treatments were needed:1} Final Clinical Impression(s) / ED Diagnoses Final diagnoses:  None    Rx / DC Orders ED Discharge Orders     None

## 2023-01-27 NOTE — ED Triage Notes (Signed)
Chest pain radiates to back Crushing pain that comes and goes  4/10 B/L arm numbness Jaw pain Started 2.5 hours ago  Complains of burping a lot today   Pt takes lose dose ASA daily Ran out of ASA last week Took 2 tablets of aleve 1 hour ago.   Pt also complains of HA that started 30 mins ago

## 2023-01-28 ENCOUNTER — Other Ambulatory Visit (HOSPITAL_COMMUNITY): Payer: Medicare HMO

## 2023-01-28 DIAGNOSIS — I110 Hypertensive heart disease with heart failure: Secondary | ICD-10-CM | POA: Diagnosis not present

## 2023-01-28 DIAGNOSIS — J45909 Unspecified asthma, uncomplicated: Secondary | ICD-10-CM | POA: Diagnosis not present

## 2023-01-28 DIAGNOSIS — E119 Type 2 diabetes mellitus without complications: Secondary | ICD-10-CM

## 2023-01-28 DIAGNOSIS — R7989 Other specified abnormal findings of blood chemistry: Secondary | ICD-10-CM | POA: Insufficient documentation

## 2023-01-28 DIAGNOSIS — Z79899 Other long term (current) drug therapy: Secondary | ICD-10-CM | POA: Diagnosis not present

## 2023-01-28 DIAGNOSIS — I214 Non-ST elevation (NSTEMI) myocardial infarction: Secondary | ICD-10-CM

## 2023-01-28 DIAGNOSIS — I10 Essential (primary) hypertension: Secondary | ICD-10-CM

## 2023-01-28 DIAGNOSIS — I519 Heart disease, unspecified: Secondary | ICD-10-CM | POA: Diagnosis not present

## 2023-01-28 DIAGNOSIS — Z85828 Personal history of other malignant neoplasm of skin: Secondary | ICD-10-CM | POA: Diagnosis not present

## 2023-01-28 DIAGNOSIS — E669 Obesity, unspecified: Secondary | ICD-10-CM | POA: Diagnosis not present

## 2023-01-28 DIAGNOSIS — I447 Left bundle-branch block, unspecified: Secondary | ICD-10-CM | POA: Diagnosis not present

## 2023-01-28 DIAGNOSIS — R079 Chest pain, unspecified: Secondary | ICD-10-CM | POA: Diagnosis not present

## 2023-01-28 DIAGNOSIS — Z87891 Personal history of nicotine dependence: Secondary | ICD-10-CM | POA: Diagnosis not present

## 2023-01-28 DIAGNOSIS — Z8249 Family history of ischemic heart disease and other diseases of the circulatory system: Secondary | ICD-10-CM | POA: Diagnosis not present

## 2023-01-28 DIAGNOSIS — E66812 Obesity, class 2: Secondary | ICD-10-CM | POA: Diagnosis not present

## 2023-01-28 DIAGNOSIS — I2510 Atherosclerotic heart disease of native coronary artery without angina pectoris: Secondary | ICD-10-CM | POA: Diagnosis not present

## 2023-01-28 DIAGNOSIS — K219 Gastro-esophageal reflux disease without esophagitis: Secondary | ICD-10-CM | POA: Diagnosis not present

## 2023-01-28 DIAGNOSIS — E785 Hyperlipidemia, unspecified: Secondary | ICD-10-CM | POA: Diagnosis not present

## 2023-01-28 DIAGNOSIS — I43 Cardiomyopathy in diseases classified elsewhere: Secondary | ICD-10-CM | POA: Diagnosis not present

## 2023-01-28 DIAGNOSIS — Z87442 Personal history of urinary calculi: Secondary | ICD-10-CM | POA: Diagnosis not present

## 2023-01-28 DIAGNOSIS — Z90710 Acquired absence of both cervix and uterus: Secondary | ICD-10-CM | POA: Diagnosis not present

## 2023-01-28 DIAGNOSIS — Z9049 Acquired absence of other specified parts of digestive tract: Secondary | ICD-10-CM | POA: Diagnosis not present

## 2023-01-28 DIAGNOSIS — I5021 Acute systolic (congestive) heart failure: Secondary | ICD-10-CM | POA: Diagnosis not present

## 2023-01-28 DIAGNOSIS — Z7982 Long term (current) use of aspirin: Secondary | ICD-10-CM | POA: Diagnosis not present

## 2023-01-28 DIAGNOSIS — Z23 Encounter for immunization: Secondary | ICD-10-CM | POA: Diagnosis not present

## 2023-01-28 DIAGNOSIS — D72829 Elevated white blood cell count, unspecified: Secondary | ICD-10-CM | POA: Diagnosis not present

## 2023-01-28 DIAGNOSIS — M1990 Unspecified osteoarthritis, unspecified site: Secondary | ICD-10-CM | POA: Diagnosis not present

## 2023-01-28 DIAGNOSIS — Z6838 Body mass index (BMI) 38.0-38.9, adult: Secondary | ICD-10-CM | POA: Diagnosis not present

## 2023-01-28 DIAGNOSIS — Z7984 Long term (current) use of oral hypoglycemic drugs: Secondary | ICD-10-CM | POA: Diagnosis not present

## 2023-01-28 DIAGNOSIS — Z955 Presence of coronary angioplasty implant and graft: Secondary | ICD-10-CM | POA: Diagnosis not present

## 2023-01-28 HISTORY — DX: Non-ST elevation (NSTEMI) myocardial infarction: I21.4

## 2023-01-28 LAB — HEMOGLOBIN A1C
Hgb A1c MFr Bld: 6.9 % — ABNORMAL HIGH (ref 4.8–5.6)
Mean Plasma Glucose: 151.33 mg/dL

## 2023-01-28 LAB — CBC
HCT: 36.2 % (ref 36.0–46.0)
Hemoglobin: 11.6 g/dL — ABNORMAL LOW (ref 12.0–15.0)
MCH: 27.9 pg (ref 26.0–34.0)
MCHC: 32 g/dL (ref 30.0–36.0)
MCV: 87 fL (ref 80.0–100.0)
Platelets: 312 10*3/uL (ref 150–400)
RBC: 4.16 MIL/uL (ref 3.87–5.11)
RDW: 16.1 % — ABNORMAL HIGH (ref 11.5–15.5)
WBC: 13.3 10*3/uL — ABNORMAL HIGH (ref 4.0–10.5)
nRBC: 0 % (ref 0.0–0.2)

## 2023-01-28 LAB — GLUCOSE, CAPILLARY
Glucose-Capillary: 128 mg/dL — ABNORMAL HIGH (ref 70–99)
Glucose-Capillary: 162 mg/dL — ABNORMAL HIGH (ref 70–99)

## 2023-01-28 LAB — HEPARIN LEVEL (UNFRACTIONATED)
Heparin Unfractionated: 0.28 [IU]/mL — ABNORMAL LOW (ref 0.30–0.70)
Heparin Unfractionated: <0.1 [IU]/mL — ABNORMAL LOW (ref 0.30–0.70)

## 2023-01-28 LAB — TROPONIN I (HIGH SENSITIVITY)
Troponin I (High Sensitivity): 46 ng/L — ABNORMAL HIGH (ref <18)
Troponin I (High Sensitivity): 109 ng/L (ref <18)
Troponin I (High Sensitivity): 140 ng/L (ref <18)

## 2023-01-28 LAB — CBG MONITORING, ED
Glucose-Capillary: 98 mg/dL (ref 70–99)
Glucose-Capillary: 251 mg/dL — ABNORMAL HIGH (ref 70–99)

## 2023-01-28 LAB — HIV ANTIBODY (ROUTINE TESTING W REFLEX): HIV Screen 4th Generation wRfx: NONREACTIVE

## 2023-01-28 MED ORDER — LORATADINE 10 MG PO TABS
10.0000 mg | ORAL_TABLET | Freq: Every day | ORAL | Status: DC
  Administered 2023-01-28 – 2023-01-31 (×4): 10 mg via ORAL
  Filled 2023-01-28 (×4): qty 1

## 2023-01-28 MED ORDER — HEPARIN (PORCINE) 25000 UT/250ML-% IV SOLN
1100.0000 [IU]/h | INTRAVENOUS | Status: DC
  Administered 2023-01-28: 850 [IU]/h via INTRAVENOUS
  Administered 2023-01-29: 950 [IU]/h via INTRAVENOUS
  Administered 2023-01-30: 1100 [IU]/h via INTRAVENOUS
  Filled 2023-01-28 (×3): qty 250

## 2023-01-28 MED ORDER — IRBESARTAN 150 MG PO TABS
150.0000 mg | ORAL_TABLET | Freq: Every day | ORAL | Status: DC
  Administered 2023-01-28 – 2023-01-29 (×2): 150 mg via ORAL
  Filled 2023-01-28 (×2): qty 1

## 2023-01-28 MED ORDER — FLUTICASONE FUROATE-VILANTEROL 100-25 MCG/ACT IN AEPB
1.0000 | INHALATION_SPRAY | Freq: Every day | RESPIRATORY_TRACT | Status: DC
  Filled 2023-01-28: qty 28

## 2023-01-28 MED ORDER — HEPARIN BOLUS VIA INFUSION
4000.0000 [IU] | Freq: Once | INTRAVENOUS | Status: AC
  Administered 2023-01-28: 4000 [IU] via INTRAVENOUS

## 2023-01-28 MED ORDER — ASPIRIN 81 MG PO TBEC
81.0000 mg | DELAYED_RELEASE_TABLET | Freq: Every day | ORAL | Status: DC
  Administered 2023-01-28 – 2023-01-31 (×4): 81 mg via ORAL
  Filled 2023-01-28 (×4): qty 1

## 2023-01-28 MED ORDER — SIMVASTATIN 20 MG PO TABS
40.0000 mg | ORAL_TABLET | Freq: Every evening | ORAL | Status: DC
  Administered 2023-01-28 – 2023-01-29 (×2): 40 mg via ORAL
  Filled 2023-01-28 (×2): qty 2

## 2023-01-28 MED ORDER — ONDANSETRON HCL 4 MG/2ML IJ SOLN: 4.0000 mg | Freq: Four times a day (QID) | INTRAMUSCULAR | Status: DC | PRN

## 2023-01-28 MED ORDER — AMLODIPINE BESYLATE 5 MG PO TABS
5.0000 mg | ORAL_TABLET | Freq: Every day | ORAL | Status: DC
  Administered 2023-01-28 – 2023-01-30 (×3): 5 mg via ORAL
  Filled 2023-01-28 (×3): qty 1

## 2023-01-28 MED ORDER — METOPROLOL SUCCINATE ER 25 MG PO TB24
25.0000 mg | ORAL_TABLET | Freq: Every day | ORAL | Status: DC
Start: 2023-01-28 — End: 2023-01-28

## 2023-01-28 MED ORDER — ACETAMINOPHEN 325 MG PO TABS
650.0000 mg | ORAL_TABLET | ORAL | Status: DC | PRN
Start: 2023-01-28 — End: 2023-01-31

## 2023-01-28 MED ORDER — INSULIN ASPART 100 UNIT/ML IJ SOLN
0.0000 [IU] | Freq: Every day | INTRAMUSCULAR | Status: DC
  Administered 2023-01-29: 2 [IU] via SUBCUTANEOUS

## 2023-01-28 MED ORDER — INFLUENZA VAC A&B SURF ANT ADJ 0.5 ML IM SUSY
0.5000 mL | PREFILLED_SYRINGE | INTRAMUSCULAR | Status: AC
  Administered 2023-01-31: 0.5 mL via INTRAMUSCULAR
  Filled 2023-01-28: qty 0.5

## 2023-01-28 MED ORDER — INSULIN ASPART 100 UNIT/ML IJ SOLN
0.0000 [IU] | Freq: Three times a day (TID) | INTRAMUSCULAR | Status: DC
  Administered 2023-01-28: 8 [IU] via SUBCUTANEOUS
  Administered 2023-01-28: 2 [IU] via SUBCUTANEOUS
  Administered 2023-01-29 (×2): 5 [IU] via SUBCUTANEOUS
  Administered 2023-01-29 – 2023-01-30 (×2): 3 [IU] via SUBCUTANEOUS
  Administered 2023-01-30 – 2023-01-31 (×2): 5 [IU] via SUBCUTANEOUS
  Filled 2023-01-28: qty 1

## 2023-01-28 MED ORDER — METOPROLOL SUCCINATE ER 100 MG PO TB24
100.0000 mg | ORAL_TABLET | Freq: Every day | ORAL | Status: DC
  Administered 2023-01-29 – 2023-01-31 (×3): 100 mg via ORAL
  Filled 2023-01-28: qty 1
  Filled 2023-01-28: qty 2
  Filled 2023-01-28 (×2): qty 1

## 2023-01-28 MED ORDER — PANTOPRAZOLE SODIUM 40 MG PO TBEC
40.0000 mg | DELAYED_RELEASE_TABLET | Freq: Every day | ORAL | Status: DC
  Administered 2023-01-28 – 2023-01-31 (×4): 40 mg via ORAL
  Filled 2023-01-28 (×4): qty 1

## 2023-01-28 NOTE — Progress Notes (Signed)
  Progress Note   Patient: Leslie [name] ZOX:[id number] DOB: 21-Feb-1956 DOA: 01/27/2023     0 DOS: the patient was seen and examined on 01/28/2023   Brief hospital course: Leslie [name] was admitted to the hospital with the working diagnosis of chest pain to rule out acute coronary syndrome.   67 yo female with the past medical history of asthma, T2DM, hyperlipidemia, hypertension and obesity who presented with chest pain. Reported intermittent precordial chest pain, after walking from her car to her home, pressure in nature and radiated to her shoulders, and associated with heaviness in her arms. Patient has been of aspirin for several weeks and has noted persistent edema at her lower extremities for the last 2 days. On her initial examination her blood pressure was 125/64, HR 80, RR 24 and 02 saturation 97% on room air, lungs with no wheezing or rales, heart with S1 and S2 present and regular, abdomen with no distention, positive bilateral lower extremity edema, ++ at the ankles and feet.   Na 138, K 4.1 CL 104 bicarbonate 22, glucose 112, bun 10 cr 0,85 High sensitive troponin 9, 46, 109 and 140  Wbc 14. Hgb 12.9 plt 313   Chest radiograph with no infiltrates or effusions, no cardiomegaly.   EKG 94 bpm, normal axis, left bundle branch block, sinus rhythm with left atrial enlargement, poor R R wave progression with no significant ST segment or T wave changes. (Left bundle new from 2017).   Patient was placed on heparin for anticoagulation and plan for transfer to [facility] for further cardiac evaluation.   10/26 patient is chest pain free.    Assessment and Plan: * NSTEMI (non-ST elevated myocardial infarction) [facility]) Patient chest pain free this morning.  Her high sensitive troponin is trending up. Follow up on echocardiogram  She continue hemodynamically stable. Continue medical therapy with aspirin, statin, and ARB Resume metoprolol succinate.  Close follow up telemetry.  Plan to continue  IV heparin for anticoagulation, and transfer to [facility] for further cardiac care.   Essential hypertension Blood pressure control with metoprolol, ARB and amlodipine.  Diabetes mellitus type 2, controlled (HCC) Continue glucose cover and monitoring with insulin sliding scale.  Hold metformin and oral hypoglycemic agents for now. Patient is currently NPO.   GERD (gastroesophageal reflux disease) Continue proton pump inhibitor.   Obesity (BMI 30-39.9) Calculated BMI is 38, consistent with obesity class 2.        Subjective: Patient with no chest pain or dyspnea, no nausea or vomiting.   Physical Exam: Vitals:   01/28/23 0734 01/28/23 0745 01/28/23 0800 01/28/23 0815  BP:   110/76   Pulse:  93 80 81  Resp:   15 14  Temp: 98.2 F (36.8 C)     TempSrc: Oral     SpO2:  97% 96% 95%  Weight:      Height:       Neurology awake and alert ENT with no pallor Cardiovascular with S1 and S2 present and regular with no gallops, rubs or murmurs Respiratory with no rales or wheezing, no rhonchi Abdomen with no distention Positive lower extremity edema ++ at the ankles and feet.  Data Reviewed:    Family Communication: no family a the bedside   Disposition: Status is: Observation The patient remains OBS appropriate and will d/c before 2 midnights.  Planned Discharge Destination: Home      Author: Coralie Keens, MD 01/28/2023 8:27 AM  For on call review [email or web address].

## 2023-01-28 NOTE — Plan of Care (Signed)
Problem: Education: Goal: Ability to describe self-care measures that may prevent or decrease complications (Diabetes Survival Skills Education) will improve 01/28/2023 2317 by Royetta Crochet, RN Outcome: Progressing 01/28/2023 2317 by Royetta Crochet, RN Outcome: Progressing Goal: Individualized Educational Video(s) 01/28/2023 2317 by Royetta Crochet, RN Outcome: Progressing 01/28/2023 2317 by Royetta Crochet, RN Outcome: Progressing   Problem: Coping: Goal: Ability to adjust to condition or change in health will improve 01/28/2023 2317 by Royetta Crochet, RN Outcome: Progressing 01/28/2023 2317 by Royetta Crochet, RN Outcome: Progressing   Problem: Fluid Volume: Goal: Ability to maintain a balanced intake and output will improve 01/28/2023 2317 by Royetta Crochet, RN Outcome: Progressing 01/28/2023 2317 by Royetta Crochet, RN Outcome: Progressing   Problem: Health Behavior/Discharge Planning: Goal: Ability to identify and utilize available resources and services will improve 01/28/2023 2317 by Royetta Crochet, RN Outcome: Progressing 01/28/2023 2317 by Royetta Crochet, RN Outcome: Progressing Goal: Ability to manage health-related needs will improve 01/28/2023 2317 by Royetta Crochet, RN Outcome: Progressing 01/28/2023 2317 by Royetta Crochet, RN Outcome: Progressing   Problem: Metabolic: Goal: Ability to maintain appropriate glucose levels will improve 01/28/2023 2317 by Royetta Crochet, RN Outcome: Progressing 01/28/2023 2317 by Royetta Crochet, RN Outcome: Progressing   Problem: Nutritional: Goal: Maintenance of adequate nutrition will improve 01/28/2023 2317 by Royetta Crochet, RN Outcome: Progressing 01/28/2023 2317 by Royetta Crochet, RN Outcome: Progressing Goal: Progress toward achieving an optimal weight will improve 01/28/2023 2317 by Royetta Crochet, RN Outcome: Progressing 01/28/2023 2317 by Royetta Crochet, RN Outcome:  Progressing   Problem: Skin Integrity: Goal: Risk for impaired skin integrity will decrease 01/28/2023 2317 by Royetta Crochet, RN Outcome: Progressing 01/28/2023 2317 by Royetta Crochet, RN Outcome: Progressing   Problem: Tissue Perfusion: Goal: Adequacy of tissue perfusion will improve 01/28/2023 2317 by Royetta Crochet, RN Outcome: Progressing 01/28/2023 2317 by Royetta Crochet, RN Outcome: Progressing   Problem: Education: Goal: Understanding of cardiac disease, CV risk reduction, and recovery process will improve 01/28/2023 2317 by Royetta Crochet, RN Outcome: Progressing 01/28/2023 2317 by Royetta Crochet, RN Outcome: Progressing Goal: Individualized Educational Video(s) 01/28/2023 2317 by Royetta Crochet, RN Outcome: Progressing 01/28/2023 2317 by Royetta Crochet, RN Outcome: Progressing   Problem: Activity: Goal: Ability to tolerate increased activity will improve 01/28/2023 2317 by Royetta Crochet, RN Outcome: Progressing 01/28/2023 2317 by Royetta Crochet, RN Outcome: Progressing   Problem: Cardiac: Goal: Ability to achieve and maintain adequate cardiovascular perfusion will improve 01/28/2023 2317 by Royetta Crochet, RN Outcome: Progressing 01/28/2023 2317 by Royetta Crochet, RN Outcome: Progressing   Problem: Health Behavior/Discharge Planning: Goal: Ability to safely manage health-related needs after discharge will improve 01/28/2023 2317 by Royetta Crochet, RN Outcome: Progressing 01/28/2023 2317 by Royetta Crochet, RN Outcome: Progressing   Problem: Education: Goal: Knowledge of General Education information will improve Description: Including pain rating scale, medication(s)/side effects and non-pharmacologic comfort measures 01/28/2023 2317 by Royetta Crochet, RN Outcome: Progressing 01/28/2023 2317 by Royetta Crochet, RN Outcome: Progressing   Problem: Health Behavior/Discharge Planning: Goal: Ability to manage health-related  needs will improve 01/28/2023 2317 by Royetta Crochet, RN Outcome: Progressing 01/28/2023 2317 by Royetta Crochet, RN Outcome: Progressing   Problem: Clinical Measurements: Goal: Ability to maintain clinical measurements within normal limits will improve 01/28/2023 2317 by Royetta Crochet, RN Outcome: Progressing 01/28/2023 2317 by Royetta Crochet, RN  Outcome: Progressing Goal: Will remain free from infection 01/28/2023 2317 by Royetta Crochet, RN Outcome: Progressing 01/28/2023 2317 by Royetta Crochet, RN Outcome: Progressing Goal: Diagnostic test results will improve 01/28/2023 2317 by Royetta Crochet, RN Outcome: Progressing 01/28/2023 2317 by Royetta Crochet, RN Outcome: Progressing Goal: Respiratory complications will improve 01/28/2023 2317 by Royetta Crochet, RN Outcome: Progressing 01/28/2023 2317 by Royetta Crochet, RN Outcome: Progressing Goal: Cardiovascular complication will be avoided 01/28/2023 2317 by Royetta Crochet, RN Outcome: Progressing 01/28/2023 2317 by Royetta Crochet, RN Outcome: Progressing   Problem: Activity: Goal: Risk for activity intolerance will decrease 01/28/2023 2317 by Royetta Crochet, RN Outcome: Progressing 01/28/2023 2317 by Royetta Crochet, RN Outcome: Progressing   Problem: Nutrition: Goal: Adequate nutrition will be maintained 01/28/2023 2317 by Royetta Crochet, RN Outcome: Progressing 01/28/2023 2317 by Royetta Crochet, RN Outcome: Progressing   Problem: Coping: Goal: Level of anxiety will decrease 01/28/2023 2317 by Royetta Crochet, RN Outcome: Progressing 01/28/2023 2317 by Royetta Crochet, RN Outcome: Progressing   Problem: Elimination: Goal: Will not experience complications related to bowel motility 01/28/2023 2317 by Royetta Crochet, RN Outcome: Progressing 01/28/2023 2317 by Royetta Crochet, RN Outcome: Progressing Goal: Will not experience complications related to urinary  retention 01/28/2023 2317 by Royetta Crochet, RN Outcome: Progressing 01/28/2023 2317 by Royetta Crochet, RN Outcome: Progressing   Problem: Pain Management: Goal: General experience of comfort will improve 01/28/2023 2317 by Royetta Crochet, RN Outcome: Progressing 01/28/2023 2317 by Royetta Crochet, RN Outcome: Progressing   Problem: Safety: Goal: Ability to remain free from injury will improve 01/28/2023 2317 by Royetta Crochet, RN Outcome: Progressing 01/28/2023 2317 by Royetta Crochet, RN Outcome: Progressing   Problem: Skin Integrity: Goal: Risk for impaired skin integrity will decrease 01/28/2023 2317 by Royetta Crochet, RN Outcome: Progressing 01/28/2023 2317 by Royetta Crochet, RN Outcome: Progressing

## 2023-01-28 NOTE — ED Provider Notes (Signed)
  Physical Exam  BP 107/73   Pulse 86   Temp 98.4 F (36.9 C) (Oral)   Resp 18   Ht 5\' 3"  (1.6 m)   Wt 97.8 kg   SpO2 99%   BMI 38.19 kg/m   Physical Exam Vitals and nursing note reviewed.  Constitutional:      General: She is not in acute distress.    Appearance: She is well-developed.  HENT:     Head: Normocephalic and atraumatic.  Eyes:     Conjunctiva/sclera: Conjunctivae normal.  Cardiovascular:     Rate and Rhythm: Normal rate and regular rhythm.     Heart sounds: No murmur heard. Pulmonary:     Effort: Pulmonary effort is normal. No respiratory distress.     Breath sounds: Normal breath sounds.  Abdominal:     Palpations: Abdomen is soft.     Tenderness: There is no abdominal tenderness.  Musculoskeletal:        General: No swelling.     Cervical back: Neck supple.  Skin:    General: Skin is warm and dry.     Capillary Refill: Capillary refill takes less than 2 seconds.  Neurological:     Mental Status: She is alert.  Psychiatric:        Mood and Affect: Mood normal.     Procedures  .Critical Care  Performed by: Glendora Score, MD Authorized by: Glendora Score, MD   Critical care provider statement:    Critical care time (minutes):  30   Critical care was necessary to treat or prevent imminent or life-threatening deterioration of the following conditions:  Cardiac failure   Critical care was time spent personally by me on the following activities:  Development of treatment plan with patient or surrogate, discussions with consultants, evaluation of patient's response to treatment, examination of patient, ordering and review of laboratory studies, ordering and review of radiographic studies, ordering and performing treatments and interventions, pulse oximetry, re-evaluation of patient's condition and review of old charts   ED Course / MDM   Clinical Course as of 01/28/23 0329  Fri Jan 27, 2023  2324 Signed out to Dr Posey Rea [RP]    Clinical Course  User Index [RP] Rondel Baton, MD   Medical Decision Making Amount and/or Complexity of Data Reviewed Labs: ordered. Radiology: ordered. ECG/medicine tests: ordered.  Risk OTC drugs. Prescription drug management. Decision regarding hospitalization.   Received in handoff.  Moderate risk chest pain with 3-hour episode of chest pain that radiated up into the shoulders and jaw.  Pending delta troponin at time of signout.  Delta troponin is elevated to 46.  Spoke with cardiologist on-call Dr. Orson Aloe who is recommending starting heparin drip and admitted to hospital service at [facility].  Patient admitted       Glendora Score, MD 01/28/23 0330

## 2023-01-28 NOTE — Assessment & Plan Note (Addendum)
Continue proton pump inhibitor

## 2023-01-28 NOTE — Assessment & Plan Note (Addendum)
Blood pressure control with metoprolol, ARB and amlodipine.

## 2023-01-28 NOTE — Assessment & Plan Note (Addendum)
Continue glucose cover and monitoring with insulin sliding scale.  Hold metformin and oral hypoglycemic agents for now. Patient is currently NPO.

## 2023-01-28 NOTE — Assessment & Plan Note (Addendum)
Calculated BMI is 38, consistent with obesity class 2.

## 2023-01-28 NOTE — Progress Notes (Signed)
PHARMACY - ANTICOAGULATION CONSULT NOTE  Pharmacy Consult for heparin Indication: chest pain/ACS  Allergies  Allergen Reactions   Hydrochlorothiazide Other (See Comments)    Caused High sugars   Morphine And Codeine Nausea And Vomiting   Tape Other (See Comments)    And bandaids also, causes skin rawness and tears   Lisinopril Cough    Patient Measurements: Height: 5\' 3"  (160 cm) Weight: 96.3 kg (212 lb 4.9 oz) IBW/kg (Calculated) : 52.4 Heparin Dosing Weight: 74.4 kg  Vital Signs: Temp: 98.3 F (36.8 C) (10/26 1453) Temp Source: Oral (10/26 1453) BP: 151/74 (10/26 1453) Pulse Rate: 91 (10/26 1826)  Labs: Recent Labs    01/27/23 2106 01/27/23 2300 01/28/23 0218 01/28/23 0451 01/28/23 1004 01/28/23 1844  HGB 12.1  --   --  11.6*  --   --   HCT 38.1  --   --  36.2  --   --   PLT 313  --   --  312  --   --   HEPARINUNFRC  --   --   --   --  0.28* <0.10*  CREATININE 0.85  --   --   --   --   --   TROPONINIHS 9 46* 109* 140*  --   --     Estimated Creatinine Clearance: 71 mL/min (by C-G formula based on SCr of 0.85 mg/dL).  Assessment: 18 yoF presented to the ED with chest pain. Pharmacy consulted to dose heparin for ACS  CBC stable, trops 46, not oral anticoagulation prior to admit.   Initial heparin level just below goal at 0.28 on 850 units/hr. No bleeding or IV issues noted.  10/26 2nd shift: heparin level returned <0.10. Per night shift RN, the heparin was likely paused over the last few hours due to line issues. A new peripheral IV was placed 10/26 @ 18:17. She would otherwise likely be therapeutic given the recent dose increase with a heparin level of 0.28 prior to that increase.   Goal of Therapy:  Heparin level 0.3-0.7 units/ml Monitor platelets by anticoagulation protocol: Yes   Plan:  Continue heparin at 950 units/hr Recheck heparin level in 6 hours   Cedric Fishman, PharmD, BCPS, BCCCP Clinical Pharmacist

## 2023-01-28 NOTE — Progress Notes (Signed)
   01/28/23 1419  TOC Brief Assessment  Insurance and Status Reviewed  Patient has primary care physician Yes  Home environment has been reviewed From Home w spouse  Prior level of function: Independent  Prior/Current Home Services No current home services  Social Determinants of Health Reivew SDOH reviewed no interventions necessary  Readmission risk has been reviewed Yes  Transition of care needs transition of care needs identified, TOC will continue to follow     Transition of Care Department (TOC) has reviewed patient and no TOC needs have been identified at this time. We will continue to monitor patient advancement through interdisciplinary progression rounds. Pt may transfer to [facility] for continued care. If new patient transition needs arise, please place a TOC consult.

## 2023-01-28 NOTE — Consult Note (Signed)
Cardiology Consultation   Patient ID: Leslie [name] MRN: [id number]; DOB: Sep 06, 1955  Admit date: 01/27/2023 Date of Consult: 01/28/2023  PCP:  Ignatius Specking, MD    [facility] Providers Cardiologist: New to [facility]   Patient Profile:   Leslie [name] is a 67 y.o. female with a hx of HTN, HLD and Type 2 DM who is being seen 01/28/2023 for the evaluation of an NSTEMI at the request of Dr. Ella Jubilee.  History of Present Illness:   Leslie [name] presented to [facility] ED on 01/27/2023 for evaluation of chest pain which radiated into her back and into her jaw and arms bilaterally. Reported symptoms started approximately 2.5 hours prior to arrival. Symptoms were waxing and waning but increasing in severity and this prompted her to come to the ED for evaluation.  Initial labs showed WBC 14.0, Hgb 12.1, platelets 313, Na+ 138, K+ 4.1 and creatinine 0.85. Initial Hs Troponin was negative but repeat values have been elevated at 46, 109 and 140. CXR shows minimal linear atelectasis or scarring in the left midlung. EKG shows NSR, heart rate 83 with LBBB which appears new since prior tracings in 2017.  Past Medical History:  Diagnosis Date   Arthritis    Asthma    Cancer (HCC)    skin cancer   Diabetes mellitus without complication (HCC)    Type 2 NIDDM x 3 years   GERD (gastroesophageal reflux disease)    Hernia, ventral    History of kidney stones    Hyperlipidemia    Hypertension    on meds for 2 years   PONV (postoperative nausea and vomiting)     Past Surgical History:  Procedure Laterality Date   ABDOMINAL HYSTERECTOMY  2001   BREAST BIOPSY Left    CATARACT EXTRACTION Left    CATARACT EXTRACTION W/PHACO Right 11/16/2015   Procedure: CATARACT EXTRACTION PHACO AND INTRAOCULAR LENS PLACEMENT RIGHT EYE; CDE:  8.65;  Surgeon: Gemma Payor, MD;  Location: [facility];  Service: Ophthalmology;  Laterality: Right;   CHOLECYSTECTOMY  1991   HERNIA REPAIR  2002   inscisional hernia x4    INCISION AND DRAINAGE ABSCESS N/A 07/02/2012   Procedure: INCISION AND DRAINAGE ABDOMINAL WALL ABSCESS;  Surgeon: Cherylynn Ridges, MD;  Location: [facility];  Service: General;  Laterality: N/A;   kidney stones  2000   cystoscopy with stone removal   TONSILLECTOMY  1967     Home Medications:  Prior to Admission medications   Medication Sig Start Date End Date Taking? Authorizing Provider  albuterol (VENTOLIN HFA) 108 (90 Base) MCG/ACT inhaler SMARTSIG:1 Puff(s) By Mouth 4 Times Daily PRN 05/10/21   [provider]  amLODipine (NORVASC) 5 MG tablet Take 5 mg by mouth daily. 05/06/21   [provider]  aspirin EC 81 MG tablet Take 81 mg by mouth daily.    [provider]  candesartan (ATACAND) 16 MG tablet Take 16 mg by mouth daily. 05/06/21   [provider]  cetirizine (ZYRTEC) 10 MG tablet Take 10 mg by mouth daily.    [provider]  fluticasone furoate-vilanterol (BREO ELLIPTA) 100-25 MCG/ACT AEPB Inhale 1 puff into the lungs daily. One puff once daily for 2 weeks 12/23/21   Oretha Milch, MD  glipiZIDE (GLUCOTROL) 10 MG tablet Take 5-10 mg by mouth 2 (two) times daily before a meal. Take 10mg  in AM and 5mg  in PM    [provider]  JANUVIA 100 MG tablet Take 100 mg  by mouth daily. 08/02/21   [provider]  metFORMIN (GLUCOPHAGE) 500 MG tablet Take 500-1,000 mg by mouth 2 (two) times daily with a meal. Take 2 tablets in AM and 1 tablet in PM    [provider]  metoprolol succinate (TOPROL-XL) 50 MG 24 hr tablet Take 25-50 mg by mouth daily. Take with or immediately following a meal.  Takes 25mg  in am and 50mg  in pm    [provider]  omeprazole (PRILOSEC) 20 MG capsule Take 20 mg by mouth daily.    [provider]  simvastatin (ZOCOR) 40 MG tablet Take 40 mg by mouth every evening.    [provider]    Inpatient Medications: Scheduled Meds:  amLODipine  5 mg Oral Daily   aspirin EC  81 mg Oral  Daily   fluticasone furoate-vilanterol  1 puff Inhalation Daily   [START ON 01/29/2023] influenza vaccine adjuvanted  0.5 mL Intramuscular Tomorrow-1000   insulin aspart  0-15 Units Subcutaneous TID WC   insulin aspart  0-5 Units Subcutaneous QHS   irbesartan  150 mg Oral Daily   loratadine  10 mg Oral Daily   metoprolol succinate  100 mg Oral Daily   pantoprazole  40 mg Oral Daily   simvastatin  40 mg Oral QPM   Continuous Infusions:  heparin 950 Units/hr (01/28/23 1241)   PRN Meds: acetaminophen, ondansetron (ZOFRAN) IV  Allergies:    Allergies  Allergen Reactions   Hydrochlorothiazide Other (See Comments)    Caused High sugars   Morphine And Codeine Nausea And Vomiting   Tape Other (See Comments)    And bandaids also, causes skin rawness and tears   Lisinopril Cough    Social History:   Social History   Socioeconomic History   Marital status: Married    Spouse name: Not on file   Number of children: Not on file   Years of education: Not on file   Highest education level: Not on file  Occupational History   Not on file  Tobacco Use   Smoking status: Former    Current packs/day: 0.00    Average packs/day: 1 pack/day for 4.0 years (4.0 ttl pk-yrs)    Types: Cigarettes    Start date: 11/12/1963    Quit date: 11/12/1967    Years since quitting: 55.2   Smokeless tobacco: Never   Tobacco comments:    quit smoking 36 years ago  Substance and Sexual Activity   Alcohol use: Yes    Comment: rare; 3 x year   Drug use: No   Sexual activity: Yes    Birth control/protection: Surgical  Other Topics Concern   Not on file  Social History Narrative   Not on file   Social Determinants of Health   Financial Resource Strain: Not on file  Food Insecurity: No Food Insecurity (01/28/2023)   Hunger Vital Sign    Worried About Running Out of Food in the Last Year: Never true    Ran Out of Food in the Last Year: Never true  Transportation Needs: No Transportation Needs  (01/28/2023)   PRAPARE - Administrator, Civil Service (Medical): No    Lack of Transportation (Non-Medical): No  Physical Activity: Not on file  Stress: Not on file  Social Connections: Not on file  Intimate Partner Violence: Not At Risk (01/28/2023)   Humiliation, Afraid, Rape, and Kick questionnaire    Fear of Current or Ex-Partner: No    Emotionally Abused: No  Physically Abused: No    Sexually Abused: No    Family History:    Family History  Problem Relation Age of Onset   Hypertension Mother    Alzheimer's disease Mother      ROS:  Please see the history of present illness.   All other ROS reviewed and negative.     Physical Exam/Data:   Vitals:   01/28/23 1315 01/28/23 1330 01/28/23 1453 01/28/23 1500  BP:   (!) 151/74   Pulse: 80 81 (!) 105 87  Resp: 15 14 18    Temp:   98.3 F (36.8 C)   TempSrc:   Oral   SpO2: 95% 94% 97% 100%  Weight:   96.3 kg   Height:   5\' 3"  (1.6 m)     Intake/Output Summary (Last 24 hours) at 01/28/2023 1540 Last data filed at 01/28/2023 1500 Gross per 24 hour  Intake 140.73 ml  Output --  Net 140.73 ml      01/28/2023    2:53 PM 01/28/2023    1:45 AM 01/27/2023    8:21 PM  Last 3 Weights  Weight (lbs) 212 lb 4.9 oz 215 lb 9.8 oz 210 lb  Weight (kg) 96.3 kg 97.8 kg 95.255 kg     Body mass index is 37.61 kg/m.  General:  obese, Well nourished, well developed, in no acute distress HEENT: normal Neck: no JVD Vascular: No carotid bruits; Distal pulses 2+ bilaterally Cardiac:  normal S1, S2; RRR; no murmur  Lungs:  clear to auscultation bilaterally, no wheezing, rhonchi or rales  Abd: soft, nontender, no hepatomegaly  Ext: no edema Musculoskeletal:  No deformities, BUE and BLE strength normal and equal Skin: warm and dry  Neuro:  CNs 2-12 intact, no focal abnormalities noted Psych:  Normal affect   EKG:  The EKG was personally reviewed and demonstrates:  NSR, heart rate 83 with LBBB which appears new  since prior tracings in 2017. Telemetry:  Telemetry was personally reviewed and demonstrates:  nsr  Relevant CV Studies:  Echocardiogram: Pending  Laboratory Data:  High Sensitivity Troponin:   Recent Labs  Lab 01/27/23 2106 01/27/23 2300 01/28/23 0218 01/28/23 0451  TROPONINIHS 9 46* 109* 140*     Chemistry Recent Labs  Lab 01/27/23 2106  NA 138  K 4.1  CL 104  CO2 22  GLUCOSE 112*  BUN 10  CREATININE 0.85  CALCIUM 9.4  GFRNONAA >60  ANIONGAP 12    No results for input(s): "PROT", "ALBUMIN", "AST", "ALT", "ALKPHOS", "BILITOT" in the last 168 hours. Lipids No results for input(s): "CHOL", "TRIG", "HDL", "LABVLDL", "LDLCALC", "CHOLHDL" in the last 168 hours.  Hematology Recent Labs  Lab 01/27/23 2106 01/28/23 0451  WBC 14.0* 13.3*  RBC 4.43 4.16  HGB 12.1 11.6*  HCT 38.1 36.2  MCV 86.0 87.0  MCH 27.3 27.9  MCHC 31.8 32.0  RDW 16.1* 16.1*  PLT 313 312   Thyroid No results for input(s): "TSH", "FREET4" in the last 168 hours.  BNPNo results for input(s): "BNP", "PROBNP" in the last 168 hours.  DDimer No results for input(s): "DDIMER" in the last 168 hours.   Radiology/Studies:  DG Chest 2 View  Result Date: 01/27/2023 CLINICAL DATA:  Chest pain EXAM: CHEST - 2 VIEW COMPARISON:  Chest x-ray 12/23/2021 FINDINGS: The heart size and mediastinal contours are within normal limits. There is minimal linear atelectasis or scarring in the left mid lung. The lungs are otherwise clear. There is no pleural effusion or pneumothorax.  Visualized skeletal structures are unremarkable. IMPRESSION: Minimal linear atelectasis or scarring in the left mid lung. Electronically Signed   By: Darliss Cheney M.D.   On: 01/27/2023 22:08     Assessment and Plan:   [place] - her troponin is slightly elevated and she has LBBB. Her symptoms are typical for angina and she is now pain free. She will be admitted and undergo left heart cath on Monday. She will continue her current meds including IV  heparin.    Sharlot Gowda Monserrate Blaschke,MD

## 2023-01-28 NOTE — Hospital Course (Signed)
Leslie [name] was admitted to the hospital with the working diagnosis of chest pain to rule out acute coronary syndrome.   67 yo female with the past medical history of asthma, T2DM, hyperlipidemia, hypertension and obesity who presented with chest pain. Reported intermittent precordial chest pain, after walking from her car to her home, pressure in nature and radiated to her shoulders, and associated with heaviness in her arms. Patient has been of aspirin for several weeks and has noted persistent edema at her lower extremities for the last 2 days. On her initial examination her blood pressure was 125/64, HR 80, RR 24 and 02 saturation 97% on room air, lungs with no wheezing or rales, heart with S1 and S2 present and regular, abdomen with no distention, positive bilateral lower extremity edema, ++ at the ankles and feet.   Na 138, K 4.1 CL 104 bicarbonate 22, glucose 112, bun 10 cr 0,85 High sensitive troponin 9, 46, 109 and 140  Wbc 14. Hgb 12.9 plt 313   Chest radiograph with no infiltrates or effusions, no cardiomegaly.   EKG 94 bpm, normal axis, left bundle branch block, sinus rhythm with left atrial enlargement, poor R R wave progression with no significant ST segment or T wave changes. (Left bundle new from 2017).   Patient was placed on heparin for anticoagulation and plan for transfer to [facility] for further cardiac evaluation.   10/26 patient is chest pain free.

## 2023-01-28 NOTE — Progress Notes (Signed)
Patient stated she only takes Breo when needed at home. She does not want it now.

## 2023-01-28 NOTE — Plan of Care (Signed)
  Problem: Coping: Goal: Ability to adjust to condition or change in health will improve Outcome: Progressing   Problem: Fluid Volume: Goal: Ability to maintain a balanced intake and output will improve Outcome: Progressing   Problem: Health Behavior/Discharge Planning: Goal: Ability to identify and utilize available resources and services will improve Outcome: Progressing Goal: Ability to manage health-related needs will improve Outcome: Progressing   

## 2023-01-28 NOTE — ED Notes (Signed)
[facility] at bedside for transport. 

## 2023-01-28 NOTE — Assessment & Plan Note (Addendum)
Patient chest pain free this morning.  Her high sensitive troponin is trending up. Follow up on echocardiogram  She continue hemodynamically stable. Continue medical therapy with aspirin, statin, and ARB Resume metoprolol succinate.  Close follow up telemetry.  Plan to continue IV heparin for anticoagulation, and transfer to [facility] for further cardiac care.

## 2023-01-28 NOTE — H&P (Signed)
History and Physical    Patient: Leslie [name] ZOX:[id number] DOB: 08-20-1955 DOA: 01/27/2023 DOS: the patient was seen and examined on 01/28/2023 PCP: Ignatius Specking, MD  Patient coming from: Home  Chief Complaint:  Chief Complaint  Patient presents with   Chest Pain   HPI: [name] is a 67 y.o. female with medical history significant of arthritis, asthma, diabetes mellitus, GERD, hyperlipidemia, hypertension, obesity, presents to the ED with a chief complaint of chest pain.  Patient reports that started around 6 or 6:30 PM.  It was pain in the center of her chest.  It felt Pressure pushing out and through to her back.  It would start very intense, and then last about 5 minutes and then gradually wear off.  She did have about 30 minutes between episodes and then it would start again.  It was not exertional.  She did not have any associated dyspnea, lightheadedness, nausea, vomiting.  She did have radiation to her shoulder blades bilaterally, heaviness in her arms, and towards the end of the episode she was having a tightness in her jaw.  She reports by the time she got to the ER her pain had resolved.  She has never had CAD.  She had chest pain 15 years ago when she had the flu.  It was also in the center of her chest and going through to her back, but somewhat different than this.  Patient does not smoke.  Up until this evening she was in her normal state of health.  Patient does not drink.  Patient is full code. Review of Systems: As mentioned in the history of present illness. All other systems reviewed and are negative. Past Medical History:  Diagnosis Date   Arthritis    Asthma    Cancer (HCC)    skin cancer   Diabetes mellitus without complication (HCC)    Type 2 NIDDM x 3 years   GERD (gastroesophageal reflux disease)    Hernia, ventral    History of kidney stones    Hyperlipidemia    Hypertension    on meds for 2 years   PONV (postoperative nausea and vomiting)    Past  Surgical History:  Procedure Laterality Date   ABDOMINAL HYSTERECTOMY  2001   BREAST BIOPSY Left    CATARACT EXTRACTION Left    CATARACT EXTRACTION W/PHACO Right 11/16/2015   Procedure: CATARACT EXTRACTION PHACO AND INTRAOCULAR LENS PLACEMENT RIGHT EYE; CDE:  8.65;  Surgeon: Gemma Payor, MD;  Location: [facility];  Service: Ophthalmology;  Laterality: Right;   CHOLECYSTECTOMY  1991   HERNIA REPAIR  2002   inscisional hernia x4   INCISION AND DRAINAGE ABSCESS N/A 07/02/2012   Procedure: INCISION AND DRAINAGE ABDOMINAL WALL ABSCESS;  Surgeon: Cherylynn Ridges, MD;  Location: [facility];  Service: General;  Laterality: N/A;   kidney stones  2000   cystoscopy with stone removal   TONSILLECTOMY  1967   Social History:  reports that she quit smoking about 55 years ago. Her smoking use included cigarettes. She started smoking about 59 years ago. She has a 4 pack-year smoking history. She has never used smokeless tobacco. She reports current alcohol use. She reports that she does not use drugs.  Allergies  Allergen Reactions   Hydrochlorothiazide Other (See Comments)    Caused High sugars   Morphine And Codeine Nausea And Vomiting   Tape Other (See Comments)    And bandaids also, causes skin rawness and tears   Lisinopril Cough  Family History  Problem Relation Age of Onset   Hypertension Mother    Alzheimer's disease Mother     Prior to Admission medications   Medication Sig Start Date End Date Taking? Authorizing Provider  albuterol (VENTOLIN HFA) 108 (90 Base) MCG/ACT inhaler SMARTSIG:1 Puff(s) By Mouth 4 Times Daily PRN 05/10/21   [provider]  amLODipine (NORVASC) 5 MG tablet Take 5 mg by mouth daily. 05/06/21   [provider]  aspirin EC 81 MG tablet Take 81 mg by mouth daily.    [provider]  candesartan (ATACAND) 16 MG tablet Take 16 mg by mouth daily. 05/06/21   [provider]  cetirizine (ZYRTEC) 10 MG tablet Take 10 mg by mouth daily.    [provider]  fluticasone furoate-vilanterol (BREO ELLIPTA) 100-25 MCG/ACT AEPB Inhale 1 puff into the lungs daily. One puff once daily for 2 weeks 12/23/21   Oretha Milch, MD  glipiZIDE (GLUCOTROL) 10 MG tablet Take 5-10 mg by mouth 2 (two) times daily before a meal. Take 10mg  in AM and 5mg  in PM    [provider]  JANUVIA 100 MG tablet Take 100 mg by mouth daily. 08/02/21   [provider]  metFORMIN (GLUCOPHAGE) 500 MG tablet Take 500-1,000 mg by mouth 2 (two) times daily with a meal. Take 2 tablets in AM and 1 tablet in PM    [provider]  metoprolol succinate (TOPROL-XL) 50 MG 24 hr tablet Take 25-50 mg by mouth daily. Take with or immediately following a meal.  Takes 25mg  in am and 50mg  in pm    [provider]  omeprazole (PRILOSEC) 20 MG capsule Take 20 mg by mouth daily.    [provider]  simvastatin (ZOCOR) 40 MG tablet Take 40 mg by mouth every evening.    [provider]    Physical Exam: Vitals:   01/28/23 0230 01/28/23 0300 01/28/23 0330 01/28/23 0400  BP: 125/64 135/75 132/74   Pulse: 80 92 91 89  Resp: 19 18 (!) 24 17  Temp:      TempSrc:      SpO2: 99% 97% 97% 96%  Weight:      Height:       1.  General: Patient lying supine in bed,  no acute distress   2. Psychiatric: Alert and oriented x 3, mood and behavior normal for situation, pleasant and cooperative with exam   3. Neurologic: Speech and language are normal, face is symmetric, moves all 4 extremities voluntarily, at baseline without acute deficits on limited exam   4. HEENMT:  Head is atraumatic, normocephalic, pupils reactive to light, neck is supple, trachea is midline, mucous membranes are moist   5. Respiratory : Lungs are clear to auscultation bilaterally without wheezing, rhonchi, rales, no cyanosis, no increase in work of breathing or accessory muscle use   6. Cardiovascular : Heart rate normal, rhythm is regular, no murmurs, rubs or  gallops, no peripheral edema, peripheral pulses palpated   7. Gastrointestinal:  Abdomen is soft, nondistended, nontender to palpation bowel sounds active, no masses or organomegaly palpated   8. Skin:  Skin is warm, dry and intact without rashes, acute lesions, or ulcers on limited exam   9.Musculoskeletal:  No acute deformities or trauma, no asymmetry in tone, no peripheral edema, peripheral pulses palpated, no tenderness to palpation in the extremities  Data Reviewed: In the ED Patient is afebrile, heart rate normal, respiratory rate normal, blood pressure 128/67-155/89,  satting 96 to 100% on room air Patient did have a leukocytosis of 14, hemoglobin is 12.1 Chemistry is unremarkable Troponin uptrending Chest x-ray shows minimal linear atelectasis or scarring EKG shows a heart rate of 83, sinus rhythm, QTc 481 Patient was given aspirin and started on heparin Cardiology was consulted and recommended transfer to [facility] for possible cardiac cath in the a.m.  Assessment and Plan: * Chest pain - With troponin elevation see corresponding assessment and plan - Chest x-ray shows minimal linear atelectasis or scarring in the left midlung - Patient does have a leukocytosis but no other signs or symptoms of pneumonia - EKG is without ST elevation - Continue to monitor  Elevated troponin - Troponin initially 9 and then 46 then 109 - Cardiology consulted and with patient's risk factors they want her admitted to [facility] for cardiac cath - N.p.o. after midnight - Currently chest pain-free - Cardiology also recommended heparin drip which has been started - Cycle troponin until we find a peak - EKG shows normal sinus rhythm with a heart rate of 83, QTc 481, no significant ST changes -Continue ARB and statin Monitor on telemetry   GERD (gastroesophageal reflux disease) - Continue PPI  Diabetes mellitus type 2, controlled (HCC) - Glucose currently well-controlled at 112 -  Continue sliding scale - Continue to monitor  Essential hypertension - Continue amlodipine, ARB  Obesity (BMI 30-39.9) - BMI 38 - Increasing her risk for CAD - Patient counseled regarding her risk of CAD and the importance of weight loss      Advance Care Planning:   Code Status: Full Code  Consults: Cardiology  Family Communication: No family at bedside  Severity of Illness: The appropriate patient status for this patient is OBSERVATION. Observation status is judged to be reasonable and necessary in order to provide the required intensity of service to ensure the patient's safety. The patient's presenting symptoms, physical exam findings, and initial radiographic and laboratory data in the context of their medical condition is felt to place them at decreased risk for further clinical deterioration. Furthermore, it is anticipated that the patient will be medically stable for discharge from the hospital within 2 midnights of admission.   Author: Lilyan Gilford, DO 01/28/2023 4:39 AM  For on call review [email or web address].

## 2023-01-28 NOTE — ED Notes (Signed)
Pt daughter updated via incoming call

## 2023-01-28 NOTE — Progress Notes (Signed)
PHARMACY - ANTICOAGULATION CONSULT NOTE  Pharmacy Consult for heparin Indication: chest pain/ACS  Allergies  Allergen Reactions   Hydrochlorothiazide Other (See Comments)    Caused High sugars   Morphine And Codeine Nausea And Vomiting   Tape Other (See Comments)    And bandaids also, causes skin rawness and tears   Lisinopril Cough    Patient Measurements: Height: 5\' 3"  (160 cm) Weight: 97.8 kg (215 lb 9.8 oz) IBW/kg (Calculated) : 52.4 Heparin Dosing Weight: 74.4 kg  Vital Signs: Temp: 98.5 F (36.9 C) (10/25 2022) Temp Source: Oral (10/25 2022) BP: 133/70 (10/26 0130) Pulse Rate: 81 (10/26 0130)  Labs: Recent Labs    01/27/23 2106 01/27/23 2300  HGB 12.1  --   HCT 38.1  --   PLT 313  --   CREATININE 0.85  --   TROPONINIHS 9 46*    Estimated Creatinine Clearance: 71.6 mL/min (by C-G formula based on SCr of 0.85 mg/dL).  Assessment: 39 yoF presented to the ED with chest pain. Pharmacy consulted to dose heparin for ACS  CBC stable, trops 46, not oral anticoagulation reported  Goal of Therapy:  Heparin level 0.3-0.7 units/ml Monitor platelets by anticoagulation protocol: Yes   Plan:  Give 4000 units bolus x 1 Start heparin infusion at 850 units/hr Check anti-Xa level in 8 hours and daily while on heparin Continue to monitor H&H and platelets  Arabella Merles, PharmD. Clinical Pharmacist 01/28/2023 1:53 AM

## 2023-01-28 NOTE — Progress Notes (Signed)
PHARMACY - ANTICOAGULATION CONSULT NOTE  Pharmacy Consult for heparin Indication: chest pain/ACS  Allergies  Allergen Reactions   Hydrochlorothiazide Other (See Comments)    Caused High sugars   Morphine And Codeine Nausea And Vomiting   Tape Other (See Comments)    And bandaids also, causes skin rawness and tears   Lisinopril Cough    Patient Measurements: Height: 5\' 3"  (160 cm) Weight: 97.8 kg (215 lb 9.8 oz) IBW/kg (Calculated) : 52.4 Heparin Dosing Weight: 74.4 kg  Vital Signs: Temp: 98.2 F (36.8 C) (10/26 0734) Temp Source: Oral (10/26 0734) BP: 110/76 (10/26 0800) Pulse Rate: 81 (10/26 0815)  Labs: Recent Labs    01/27/23 2106 01/27/23 2300 01/28/23 0218 01/28/23 0451  HGB 12.1  --   --  11.6*  HCT 38.1  --   --  36.2  PLT 313  --   --  312  CREATININE 0.85  --   --   --   TROPONINIHS 9 46* 109* 140*    Estimated Creatinine Clearance: 71.6 mL/min (by C-G formula based on SCr of 0.85 mg/dL).  Assessment: 61 yoF presented to the ED with chest pain. Pharmacy consulted to dose heparin for ACS  CBC stable, trops 46, not oral anticoagulation prior to admit.   Initial heparin level just below goal at 0.28 on 850 units/hr. No bleeding or IV issues noted.   Goal of Therapy:  Heparin level 0.3-0.7 units/ml Monitor platelets by anticoagulation protocol: Yes   Plan:  Increase heparin to 950 units/hr Recheck heparin level this evening  Sheppard Coil PharmD., BCPS Clinical Pharmacist 01/28/2023 8:34 AM

## 2023-01-28 NOTE — Assessment & Plan Note (Deleted)
-   Troponin initially 9 and then 46 then 109 - Cardiology consulted and with patient's risk factors they want her admitted to [facility] for cardiac cath - N.p.o. after midnight - Currently chest pain-free - Cardiology also recommended heparin drip which has been started - Cycle troponin until we find a peak - EKG shows normal sinus rhythm with a heart rate of 83, QTc 481, no significant ST changes -Continue ARB and statin Monitor on telemetry

## 2023-01-29 ENCOUNTER — Inpatient Hospital Stay (HOSPITAL_COMMUNITY): Payer: Medicare HMO

## 2023-01-29 DIAGNOSIS — R079 Chest pain, unspecified: Secondary | ICD-10-CM

## 2023-01-29 DIAGNOSIS — I214 Non-ST elevation (NSTEMI) myocardial infarction: Secondary | ICD-10-CM

## 2023-01-29 LAB — LIPID PANEL
Cholesterol: 123 mg/dL (ref 0–200)
HDL: 43 mg/dL (ref >40)
LDL Cholesterol: 42 mg/dL (ref 0–99)
Total CHOL/HDL Ratio: 2.9 {ratio}
Triglycerides: 190 mg/dL — ABNORMAL HIGH (ref <150)
VLDL: 38 mg/dL (ref 0–40)

## 2023-01-29 LAB — ECHOCARDIOGRAM COMPLETE
AR max vel: 1.99 cm2
AV Area VTI: 1.92 cm2
AV Area mean vel: 1.96 cm2
AV Mean grad: 4.7 mm[Hg]
AV Peak grad: 8.1 mm[Hg]
Ao pk vel: 1.43 m/s
Area-P 1/2: 5.11 cm2
Height: 63 in
S' Lateral: 3.7 cm
Weight: 3396.85 [oz_av]

## 2023-01-29 LAB — BASIC METABOLIC PANEL
Anion gap: 12 (ref 5–15)
BUN: 12 mg/dL (ref 8–23)
CO2: 23 mmol/L (ref 22–32)
Calcium: 9.1 mg/dL (ref 8.9–10.3)
Chloride: 103 mmol/L (ref 98–111)
Creatinine, Ser: 0.86 mg/dL (ref 0.44–1.00)
GFR, Estimated: >60 mL/min (ref >60)
Glucose, Bld: 131 mg/dL — ABNORMAL HIGH (ref 70–99)
Potassium: 4.2 mmol/L (ref 3.5–5.1)
Sodium: 138 mmol/L (ref 135–145)

## 2023-01-29 LAB — CBC
HCT: 36.1 % (ref 36.0–46.0)
Hemoglobin: 11.5 g/dL — ABNORMAL LOW (ref 12.0–15.0)
MCH: 26.8 pg (ref 26.0–34.0)
MCHC: 31.9 g/dL (ref 30.0–36.0)
MCV: 84.1 fL (ref 80.0–100.0)
Platelets: 296 10*3/uL (ref 150–400)
RBC: 4.29 MIL/uL (ref 3.87–5.11)
RDW: 16.1 % — ABNORMAL HIGH (ref 11.5–15.5)
WBC: 11.4 10*3/uL — ABNORMAL HIGH (ref 4.0–10.5)
nRBC: 0 % (ref 0.0–0.2)

## 2023-01-29 LAB — GLUCOSE, CAPILLARY
Glucose-Capillary: 241 mg/dL — ABNORMAL HIGH (ref 70–99)
Glucose-Capillary: 176 mg/dL — ABNORMAL HIGH (ref 70–99)
Glucose-Capillary: 215 mg/dL — ABNORMAL HIGH (ref 70–99)
Glucose-Capillary: 213 mg/dL — ABNORMAL HIGH (ref 70–99)

## 2023-01-29 LAB — HEPARIN LEVEL (UNFRACTIONATED)
Heparin Unfractionated: 0.31 [IU]/mL (ref 0.30–0.70)
Heparin Unfractionated: 0.2 [IU]/mL — ABNORMAL LOW (ref 0.30–0.70)

## 2023-01-29 MED ORDER — SODIUM CHLORIDE 0.9 % IV SOLN: INTRAVENOUS | Status: DC

## 2023-01-29 MED ORDER — PERFLUTREN LIPID MICROSPHERE
1.0000 mL | INTRAVENOUS | Status: AC | PRN
  Administered 2023-01-29: 4 mL via INTRAVENOUS

## 2023-01-29 NOTE — Progress Notes (Signed)
*  PRELIMINARY RESULTS* Echocardiogram 2D Echocardiogram has been performed.  Leslie [name] 01/29/2023, 3:28 PM

## 2023-01-29 NOTE — Progress Notes (Signed)
PHARMACY - ANTICOAGULATION CONSULT NOTE  Pharmacy Consult for heparin Indication: chest pain/ACS  Allergies  Allergen Reactions   Hydrochlorothiazide Other (See Comments)    Caused High sugars   Morphine And Codeine Nausea And Vomiting   Tape Other (See Comments)    And bandaids also, causes skin rawness and tears   Lisinopril Cough    Patient Measurements: Height: 5\' 3"  (160 cm) Weight: 96.3 kg (212 lb 4.9 oz) IBW/kg (Calculated) : 52.4 Heparin Dosing Weight: 74.4 kg  Vital Signs: Temp: 97.7 F (36.5 C) (10/27 0747) Temp Source: Oral (10/27 0747) BP: 131/61 (10/27 1050) Pulse Rate: 90 (10/27 1050)  Labs: Recent Labs    01/27/23 2106 01/27/23 2300 01/28/23 0218 01/28/23 0451 01/28/23 1004 01/28/23 1844 01/29/23 0513 01/29/23 1732  HGB 12.1  --   --  11.6*  --   --  11.5*  --   HCT 38.1  --   --  36.2  --   --  36.1  --   PLT 313  --   --  312  --   --  296  --   HEPARINUNFRC  --   --   --   --    < > <0.10* 0.31 0.20*  CREATININE 0.85  --   --   --   --   --  0.86  --   TROPONINIHS 9 46* 109* 140*  --   --   --   --    < > = values in this interval not displayed.    Estimated Creatinine Clearance: 70.1 mL/min (by C-G formula based on SCr of 0.86 mg/dL).  Assessment: 83 yoF presented to the ED with chest pain. Pharmacy consulted to dose heparin for ACS.  CBC stable, trops 46, not oral anticoagulation prior to admit. Heart cath tomorrow for [place].   Heparin level at goal 0.31 on 950 units/hr. No bleeding or IV issues per RN.  10/26 2nd shift: heparin level returned at 0.20. RN reports that the infusion was held for about 20 minutes earlier today (~12:00). This is not enough to explain the low level. Patient was just over the therapeutic threshold earlier on her current dose. Will likely not require much more heparin to be within the therapeutic range. No issues with bleeding reported. There was a delay in obtaining the level due to difficulty in sticking the  patient (failed 3 times on day shift).   Goal of Therapy:  Heparin level 0.3-0.7 units/ml Monitor platelets by anticoagulation protocol: Yes   Plan:  Increase heparin to 1100 units/hr Recheck heparin level in 6 hours  Daily CBC and heparin level  Cedric Fishman, PharmD, BCPS, BCCCP Clinical Pharmacist

## 2023-01-29 NOTE — Plan of Care (Signed)
  Problem: Education: Goal: Ability to describe self-care measures that may prevent or decrease complications (Diabetes Survival Skills Education) will improve Outcome: Progressing Goal: Individualized Educational Video(s) Outcome: Progressing   Problem: Coping: Goal: Ability to adjust to condition or change in health will improve Outcome: Progressing   Problem: Fluid Volume: Goal: Ability to maintain a balanced intake and output will improve Outcome: Progressing   Problem: Health Behavior/Discharge Planning: Goal: Ability to identify and utilize available resources and services will improve Outcome: Progressing Goal: Ability to manage health-related needs will improve Outcome: Progressing   Problem: Metabolic: Goal: Ability to maintain appropriate glucose levels will improve Outcome: Progressing   Problem: Nutritional: Goal: Maintenance of adequate nutrition will improve Outcome: Progressing Goal: Progress toward achieving an optimal weight will improve Outcome: Progressing   Problem: Skin Integrity: Goal: Risk for impaired skin integrity will decrease Outcome: Progressing   Problem: Tissue Perfusion: Goal: Adequacy of tissue perfusion will improve Outcome: Progressing   Problem: Education: Goal: Understanding of cardiac disease, CV risk reduction, and recovery process will improve Outcome: Progressing Goal: Individualized Educational Video(s) Outcome: Progressing   Problem: Activity: Goal: Ability to tolerate increased activity will improve Outcome: Progressing   Problem: Cardiac: Goal: Ability to achieve and maintain adequate cardiovascular perfusion will improve Outcome: Progressing   Problem: Health Behavior/Discharge Planning: Goal: Ability to safely manage health-related needs after discharge will improve Outcome: Progressing   Problem: Education: Goal: Knowledge of General Education information will improve Description: Including pain rating scale,  medication(s)/side effects and non-pharmacologic comfort measures Outcome: Progressing   Problem: Health Behavior/Discharge Planning: Goal: Ability to manage health-related needs will improve Outcome: Progressing   Problem: Clinical Measurements: Goal: Ability to maintain clinical measurements within normal limits will improve Outcome: Progressing Goal: Will remain free from infection Outcome: Progressing Goal: Diagnostic test results will improve Outcome: Progressing Goal: Respiratory complications will improve Outcome: Progressing Goal: Cardiovascular complication will be avoided Outcome: Progressing   Problem: Activity: Goal: Risk for activity intolerance will decrease Outcome: Progressing   Problem: Nutrition: Goal: Adequate nutrition will be maintained Outcome: Progressing   Problem: Coping: Goal: Level of anxiety will decrease Outcome: Progressing   Problem: Elimination: Goal: Will not experience complications related to bowel motility Outcome: Progressing Goal: Will not experience complications related to urinary retention Outcome: Progressing   Problem: Pain Management: Goal: General experience of comfort will improve Outcome: Progressing   Problem: Safety: Goal: Ability to remain free from injury will improve Outcome: Progressing   Problem: Skin Integrity: Goal: Risk for impaired skin integrity will decrease Outcome: Progressing

## 2023-01-29 NOTE — Progress Notes (Addendum)
PROGRESS NOTE    Leslie [name]  VWU:[id number] DOB: [birth year] DOA: 01/27/2023 PCP: Ignatius Specking, MD    Brief Narrative:   Leslie [name] is a 67 y.o. female with past medical history significant for asthma, type 2 diabetes mellitus, HTN, HLD, obesity who presented to [facility] ED on 10/25 with chest pain. Reported intermittent precordial chest pain, after walking from her car to her home, pressure in nature and radiated to her shoulders, and associated with heaviness in her arms. Patient has been of aspirin for several weeks and has noted persistent edema at her lower extremities for the last 2 days.   In the ED, temperature 98.5 F, HR 96, RR 22, BP 155/67, SpO2 100% on room air.  WBC 14.0, hemoglobin 12.1, platelet count 313.  Sodium 138, potassium 4.1, chloride 104, CO2 22, glucose 112, BUN 10, creatinine 0.85.  High sensitive troponin 9 followed by 46.  Chest x-ray with minimal lateral atelectasis versus scarring in the left midlung.  EKG 94 bpm, normal axis, left bundle branch block, sinus rhythm with left atrial enlargement, poor R R wave progression with no significant ST segment or T wave changes. (Left bundle new from 2017).  Patient was placed on heparin drip for anticoagulation.  Cardiology was consulted; who recommended transfer to [facility] for further cardiac evaluation with anticipated need of left heart catheterization.  Assessment & Plan:   Unstable angina NSTEMI Patient presenting to ED with chest pain.  Noted elevated troponin.  Cardiology was consulted and patient was transferred to [facility] for further cardiac evaluation. -- Cardiology following, appreciate assistance -- TTE: Pending -- hs troponin 9>46>109>140 -- Heparin drip --Continue aspirin and statin -- N.p.o. after midnight for planned heart catheterization 10/28  Essential Hypertension -- Amlodipine 5 mg p.o. daily -- Metoprolol succinate 100 mg p.o. daily -- Irbesartan 150 mg p.o. daily  Type 2  diabetes mellitus Home regimen includes glipizide 10 mg p.o. every morning, 5 mg every afternoon, metformin 1000 mg every morning, 5 mg every afternoon, Januvia.  Hemoglobin A1c 6.9 on 01/28/2023, well-controlled. -- Hold oral hypoglycemics while inpatient -- SSI for coverage -- CBGs qAC/HS  Dyslipidemia Lipid panel with total cholesterol 123, HDL 43, LDL 42, triglycerides 190. -- Simvastatin 40 mg p.o. nightly  Asthma -- Breo Ellipta 1 puff daily  GERD -- Protonix 40 mg p.o. daily (substituted for home omeprazole)  Obesity Body mass index is 37.61 kg/m.  Complicates all facets of care    DVT prophylaxis: SCDs Start: 01/28/23 0409    Code Status: Full Code Family Communication: No family present at bedside this morning  Disposition Plan:  Level of care: Telemetry Cardiac Status is: Inpatient Remains inpatient appropriate because: IV heparin drip, pending heart catheterization on 10/28, awaiting further recommendations from cardiology    Consultants:  Cardiology  Procedures:  TTE: Pending  Antimicrobials:  None   Subjective: Patient seen examined bedside, resting company.  Lying in bed.  Remains on heparin drip.  Remains chest pain-free.  Awaiting left heart catheterization planned for tomorrow.  Has also transthoracic echocardiogram pending.  No questions or concerns at this time.  Denies headache, no dizziness, no chest pain, no palpitations, no shortness of breath, no abdominal pain, no fever/chills/night sweats, no nausea/vomiting/diarrhea, no focal weakness, no fatigue, no paresthesias.  No acute events overnight per nursing staff.  Objective: Vitals:   01/29/23 0448 01/29/23 0515 01/29/23 0747 01/29/23 1050  BP:  (!) 151/74 (!) 103/44 131/61  Pulse: 88 89 91  90  Resp: 18 18 18    Temp:  98.2 F (36.8 C) 97.7 F (36.5 C)   TempSrc:  Oral Oral   SpO2: 97% 99% 99%   Weight:      Height:        Intake/Output Summary (Last 24 hours) at 01/29/2023 1205 Last  data filed at 01/28/2023 1500 Gross per 24 hour  Intake 140.73 ml  Output --  Net 140.73 ml   Filed Weights   01/27/23 2021 01/28/23 0145 01/28/23 1453  Weight: 95.3 kg 97.8 kg 96.3 kg    Examination:  Physical Exam: GEN: NAD, alert and oriented x 3, obese HEENT: NCAT, PERRL, EOMI, sclera clear, MMM PULM: CTAB w/o wheezes/crackles, normal respiratory effort, room air CV: RRR w/o M/G/R GI: abd soft, NTND, NABS, no R/G/M MSK: no peripheral edema, muscle strength globally intact 5/5 bilateral upper/lower extremities NEURO: CN II-XII intact, no focal deficits, sensation to light touch intact PSYCH: normal mood/affect Integumentary: dry/intact, no rashes or wounds    Data Reviewed: I have personally reviewed following labs and imaging studies  CBC: Recent Labs  Lab 01/27/23 2106 01/28/23 0451 01/29/23 0513  WBC 14.0* 13.3* 11.4*  HGB 12.1 11.6* 11.5*  HCT 38.1 36.2 36.1  MCV 86.0 87.0 84.1  PLT 313 312 296   Basic Metabolic Panel: Recent Labs  Lab 01/27/23 2106 01/29/23 0513  NA 138 138  K 4.1 4.2  CL 104 103  CO2 22 23  GLUCOSE 112* 131*  BUN 10 12  CREATININE 0.85 0.86  CALCIUM 9.4 9.1   GFR: Estimated Creatinine Clearance: 70.1 mL/min (by C-G formula based on SCr of 0.86 mg/dL). Liver Function Tests: No results for input(s): "AST", "ALT", "ALKPHOS", "BILITOT", "PROT", "ALBUMIN" in the last 168 hours. No results for input(s): "LIPASE", "AMYLASE" in the last 168 hours. No results for input(s): "AMMONIA" in the last 168 hours. Coagulation Profile: No results for input(s): "INR", "PROTIME" in the last 168 hours. Cardiac Enzymes: No results for input(s): "CKTOTAL", "CKMB", "CKMBINDEX", "TROPONINI" in the last 168 hours. BNP (last 3 results) No results for input(s): "PROBNP" in the last 8760 hours. HbA1C: Recent Labs    01/28/23 0451  HGBA1C 6.9*   CBG: Recent Labs  Lab 01/28/23 0804 01/28/23 1244 01/28/23 1716 01/28/23 2113 01/29/23 0744   GLUCAP 98 251* 128* 162* 241*   Lipid Profile: Recent Labs    01/29/23 0513  CHOL 123  HDL 43  LDLCALC 42  TRIG 190*  CHOLHDL 2.9   Thyroid Function Tests: No results for input(s): "TSH", "T4TOTAL", "FREET4", "T3FREE", "THYROIDAB" in the last 72 hours. Anemia Panel: No results for input(s): "VITAMINB12", "FOLATE", "FERRITIN", "TIBC", "IRON", "RETICCTPCT" in the last 72 hours. Sepsis Labs: No results for input(s): "PROCALCITON", "LATICACIDVEN" in the last 168 hours.  No results found for this or any previous visit (from the past 240 hour(s)).       Radiology Studies: DG Chest 2 View  Result Date: 01/27/2023 CLINICAL DATA:  Chest pain EXAM: CHEST - 2 VIEW COMPARISON:  Chest x-ray 12/23/2021 FINDINGS: The heart size and mediastinal contours are within normal limits. There is minimal linear atelectasis or scarring in the left mid lung. The lungs are otherwise clear. There is no pleural effusion or pneumothorax. Visualized skeletal structures are unremarkable. IMPRESSION: Minimal linear atelectasis or scarring in the left mid lung. Electronically Signed   By: Darliss Cheney M.D.   On: 01/27/2023 22:08        Scheduled Meds:  amLODipine  5  mg Oral Daily   aspirin EC  81 mg Oral Daily   fluticasone furoate-vilanterol  1 puff Inhalation Daily   influenza vaccine adjuvanted  0.5 mL Intramuscular Tomorrow-1000   insulin aspart  0-15 Units Subcutaneous TID WC   insulin aspart  0-5 Units Subcutaneous QHS   irbesartan  150 mg Oral Daily   loratadine  10 mg Oral Daily   metoprolol succinate  100 mg Oral Daily   pantoprazole  40 mg Oral Daily   simvastatin  40 mg Oral QPM   Continuous Infusions:  heparin 950 Units/hr (01/29/23 0116)     LOS: 1 day    Time spent: 51 minutes spent on chart review, discussion with nursing staff, consultants, updating family and interview/physical exam; more than 50% of that time was spent in counseling and/or coordination of care.    Alvira Philips  [place], DO [facility] Available via Epic secure chat 7am-7pm After these hours, please refer to coverage provider listed on [email or web address] 01/29/2023, 12:05 PM

## 2023-01-29 NOTE — H&P (View-Only) (Signed)
Progress Note  Patient Name: Leslie [name] Date of Encounter: 01/29/2023  Primary Cardiologist: None   Subjective   No chest pain or sob.   Inpatient Medications    Scheduled Meds:  amLODipine  5 mg Oral Daily   aspirin EC  81 mg Oral Daily   fluticasone furoate-vilanterol  1 puff Inhalation Daily   influenza vaccine adjuvanted  0.5 mL Intramuscular Tomorrow-1000   insulin aspart  0-15 Units Subcutaneous TID WC   insulin aspart  0-5 Units Subcutaneous QHS   irbesartan  150 mg Oral Daily   loratadine  10 mg Oral Daily   metoprolol succinate  100 mg Oral Daily   pantoprazole  40 mg Oral Daily   simvastatin  40 mg Oral QPM   Continuous Infusions:  heparin 950 Units/hr (01/29/23 0116)   PRN Meds: acetaminophen, ondansetron (ZOFRAN) IV   Vital Signs    Vitals:   01/29/23 0400 01/29/23 0448 01/29/23 0515 01/29/23 0747  BP:   (!) 151/74 (!) 103/44  Pulse:  88 89 91  Resp:  18 18 18   Temp:   98.2 F (36.8 C) 97.7 F (36.5 C)  TempSrc:   Oral Oral  SpO2: 97% 97% 99% 99%  Weight:      Height:        Intake/Output Summary (Last 24 hours) at 01/29/2023 0858 Last data filed at 01/28/2023 1500 Gross per 24 hour  Intake 140.73 ml  Output --  Net 140.73 ml   Filed Weights   01/27/23 2021 01/28/23 0145 01/28/23 1453  Weight: 95.3 kg 97.8 kg 96.3 kg    Telemetry    nsr - Personally Reviewed  ECG    none - Personally Reviewed  Physical Exam   GEN: obese, No acute distress.   Neck: No JVD Cardiac: RRR, no murmurs, rubs, or gallops.  Respiratory: Clear to auscultation bilaterally. GI: Soft, nontender, non-distended  MS: No edema; No deformity. Neuro:  Nonfocal  Psych: Normal affect   Labs    Chemistry Recent Labs  Lab 01/27/23 2106 01/29/23 0513  NA 138 138  K 4.1 4.2  CL 104 103  CO2 22 23  GLUCOSE 112* 131*  BUN 10 12  CREATININE 0.85 0.86  CALCIUM 9.4 9.1  GFRNONAA >60 >60  ANIONGAP 12 12     Hematology Recent Labs  Lab  01/27/23 2106 01/28/23 0451 01/29/23 0513  WBC 14.0* 13.3* 11.4*  RBC 4.43 4.16 4.29  HGB 12.1 11.6* 11.5*  HCT 38.1 36.2 36.1  MCV 86.0 87.0 84.1  MCH 27.3 27.9 26.8  MCHC 31.8 32.0 31.9  RDW 16.1* 16.1* 16.1*  PLT 313 312 296    Cardiac EnzymesNo results for input(s): "TROPONINI" in the last 168 hours. No results for input(s): "TROPIPOC" in the last 168 hours.   BNPNo results for input(s): "BNP", "PROBNP" in the last 168 hours.   DDimer No results for input(s): "DDIMER" in the last 168 hours.   Radiology    DG Chest 2 View  Result Date: 01/27/2023 CLINICAL DATA:  Chest pain EXAM: CHEST - 2 VIEW COMPARISON:  Chest x-ray 12/23/2021 FINDINGS: The heart size and mediastinal contours are within normal limits. There is minimal linear atelectasis or scarring in the left mid lung. The lungs are otherwise clear. There is no pleural effusion or pneumothorax. Visualized skeletal structures are unremarkable. IMPRESSION: Minimal linear atelectasis or scarring in the left mid lung. Electronically Signed   By: Darliss Cheney M.D.   On: 01/27/2023 22:08  Cardiac Studies   none  Patient Profile     67 y.o. female admitted in transfer from [facility] with chest pain and LBBB, and troponin at 140.   Assessment & Plan    [place] - she will undergo left heart cath tomorrow. Continue IV heparin.  HTN - her sbp is up a bit. We will follow.     For questions or updates, please contact [facility] Please consult [email or web address] for contact info under Cardiology/STEMI.      Signed, Lewayne Bunting, MD  01/29/2023, 8:58 AM

## 2023-01-29 NOTE — Progress Notes (Signed)
PHARMACY - ANTICOAGULATION CONSULT NOTE  Pharmacy Consult for heparin Indication: chest pain/ACS  Allergies  Allergen Reactions   Hydrochlorothiazide Other (See Comments)    Caused High sugars   Morphine And Codeine Nausea And Vomiting   Tape Other (See Comments)    And bandaids also, causes skin rawness and tears   Lisinopril Cough    Patient Measurements: Height: 5\' 3"  (160 cm) Weight: 96.3 kg (212 lb 4.9 oz) IBW/kg (Calculated) : 52.4 Heparin Dosing Weight: 74.4 kg  Vital Signs: Temp: 98.2 F (36.8 C) (10/27 0515) Temp Source: Oral (10/27 0515) BP: 151/74 (10/27 0515) Pulse Rate: 89 (10/27 0515)  Labs: Recent Labs    01/27/23 2106 01/27/23 2300 01/28/23 0218 01/28/23 0451 01/28/23 1004 01/28/23 1844 01/29/23 0513  HGB 12.1  --   --  11.6*  --   --  11.5*  HCT 38.1  --   --  36.2  --   --  36.1  PLT 313  --   --  312  --   --  296  HEPARINUNFRC  --   --   --   --  0.28* <0.10* 0.31  CREATININE 0.85  --   --   --   --   --  0.86  TROPONINIHS 9 46* 109* 140*  --   --   --     Estimated Creatinine Clearance: 70.1 mL/min (by C-G formula based on SCr of 0.86 mg/dL).  Assessment: 28 yoF presented to the ED with chest pain. Pharmacy consulted to dose heparin for ACS  CBC stable, trops 46, not oral anticoagulation prior to admit.   Heparin level at goal 0.31 on 950 units/hr. No bleeding or IV issues per RN.   Goal of Therapy:  Heparin level 0.3-0.7 units/ml Monitor platelets by anticoagulation protocol: Yes   Plan:  Continue heparin at 950 units/hr Recheck confirmatory heparin level in 6 hours  Daily CBC and heparin level  Arabella Merles, PharmD. Clinical Pharmacist 01/29/2023 6:15 AM

## 2023-01-29 NOTE — Progress Notes (Signed)
Progress Note  Patient Name: Leslie [name] Date of Encounter: 01/29/2023  Primary Cardiologist: None   Subjective   No chest pain or sob.   Inpatient Medications    Scheduled Meds:  amLODipine  5 mg Oral Daily   aspirin EC  81 mg Oral Daily   fluticasone furoate-vilanterol  1 puff Inhalation Daily   influenza vaccine adjuvanted  0.5 mL Intramuscular Tomorrow-1000   insulin aspart  0-15 Units Subcutaneous TID WC   insulin aspart  0-5 Units Subcutaneous QHS   irbesartan  150 mg Oral Daily   loratadine  10 mg Oral Daily   metoprolol succinate  100 mg Oral Daily   pantoprazole  40 mg Oral Daily   simvastatin  40 mg Oral QPM   Continuous Infusions:  heparin 950 Units/hr (01/29/23 0116)   PRN Meds: acetaminophen, ondansetron (ZOFRAN) IV   Vital Signs    Vitals:   01/29/23 0400 01/29/23 0448 01/29/23 0515 01/29/23 0747  BP:   (!) 151/74 (!) 103/44  Pulse:  88 89 91  Resp:  18 18 18   Temp:   98.2 F (36.8 C) 97.7 F (36.5 C)  TempSrc:   Oral Oral  SpO2: 97% 97% 99% 99%  Weight:      Height:        Intake/Output Summary (Last 24 hours) at 01/29/2023 0858 Last data filed at 01/28/2023 1500 Gross per 24 hour  Intake 140.73 ml  Output --  Net 140.73 ml   Filed Weights   01/27/23 2021 01/28/23 0145 01/28/23 1453  Weight: 95.3 kg 97.8 kg 96.3 kg    Telemetry    nsr - Personally Reviewed  ECG    none - Personally Reviewed  Physical Exam   GEN: obese, No acute distress.   Neck: No JVD Cardiac: RRR, no murmurs, rubs, or gallops.  Respiratory: Clear to auscultation bilaterally. GI: Soft, nontender, non-distended  MS: No edema; No deformity. Neuro:  Nonfocal  Psych: Normal affect   Labs    Chemistry Recent Labs  Lab 01/27/23 2106 01/29/23 0513  NA 138 138  K 4.1 4.2  CL 104 103  CO2 22 23  GLUCOSE 112* 131*  BUN 10 12  CREATININE 0.85 0.86  CALCIUM 9.4 9.1  GFRNONAA >60 >60  ANIONGAP 12 12     Hematology Recent Labs  Lab  01/27/23 2106 01/28/23 0451 01/29/23 0513  WBC 14.0* 13.3* 11.4*  RBC 4.43 4.16 4.29  HGB 12.1 11.6* 11.5*  HCT 38.1 36.2 36.1  MCV 86.0 87.0 84.1  MCH 27.3 27.9 26.8  MCHC 31.8 32.0 31.9  RDW 16.1* 16.1* 16.1*  PLT 313 312 296    Cardiac EnzymesNo results for input(s): "TROPONINI" in the last 168 hours. No results for input(s): "TROPIPOC" in the last 168 hours.   BNPNo results for input(s): "BNP", "PROBNP" in the last 168 hours.   DDimer No results for input(s): "DDIMER" in the last 168 hours.   Radiology    DG Chest 2 View  Result Date: 01/27/2023 CLINICAL DATA:  Chest pain EXAM: CHEST - 2 VIEW COMPARISON:  Chest x-ray 12/23/2021 FINDINGS: The heart size and mediastinal contours are within normal limits. There is minimal linear atelectasis or scarring in the left mid lung. The lungs are otherwise clear. There is no pleural effusion or pneumothorax. Visualized skeletal structures are unremarkable. IMPRESSION: Minimal linear atelectasis or scarring in the left mid lung. Electronically Signed   By: Darliss Cheney M.D.   On: 01/27/2023 22:08  Cardiac Studies   none  Patient Profile     67 y.o. female admitted in transfer from [facility] with chest pain and LBBB, and troponin at 140.   Assessment & Plan    [place] - she will undergo left heart cath tomorrow. Continue IV heparin.  HTN - her sbp is up a bit. We will follow.     For questions or updates, please contact [facility] Please consult [email or web address] for contact info under Cardiology/STEMI.      Signed, Lewayne Bunting, MD  01/29/2023, 8:58 AM

## 2023-01-30 ENCOUNTER — Inpatient Hospital Stay (HOSPITAL_COMMUNITY)
Admission: EM | Disposition: A | Discharge status: Home / Self Care | Payer: Self-pay | Source: Home / Self Care | Attending: Internal Medicine

## 2023-01-30 ENCOUNTER — Other Ambulatory Visit (HOSPITAL_COMMUNITY): Payer: Self-pay

## 2023-01-30 ENCOUNTER — Encounter (HOSPITAL_COMMUNITY): Payer: Self-pay | Admitting: Internal Medicine

## 2023-01-30 DIAGNOSIS — E785 Hyperlipidemia, unspecified: Secondary | ICD-10-CM

## 2023-01-30 DIAGNOSIS — I255 Ischemic cardiomyopathy: Secondary | ICD-10-CM

## 2023-01-30 DIAGNOSIS — Z955 Presence of coronary angioplasty implant and graft: Secondary | ICD-10-CM

## 2023-01-30 DIAGNOSIS — I2510 Atherosclerotic heart disease of native coronary artery without angina pectoris: Secondary | ICD-10-CM

## 2023-01-30 DIAGNOSIS — I214 Non-ST elevation (NSTEMI) myocardial infarction: Secondary | ICD-10-CM | POA: Diagnosis not present

## 2023-01-30 DIAGNOSIS — E119 Type 2 diabetes mellitus without complications: Secondary | ICD-10-CM | POA: Diagnosis not present

## 2023-01-30 DIAGNOSIS — I519 Heart disease, unspecified: Secondary | ICD-10-CM

## 2023-01-30 HISTORY — DX: Ischemic cardiomyopathy: I25.5

## 2023-01-30 HISTORY — PX: CORONARY STENT INTERVENTION: CATH118234

## 2023-01-30 HISTORY — PX: LEFT HEART CATH AND CORONARY ANGIOGRAPHY: CATH118249

## 2023-01-30 LAB — CBC
HCT: 34.6 % — ABNORMAL LOW (ref 36.0–46.0)
Hemoglobin: 10.7 g/dL — ABNORMAL LOW (ref 12.0–15.0)
MCH: 26.4 pg (ref 26.0–34.0)
MCHC: 30.9 g/dL (ref 30.0–36.0)
MCV: 85.2 fL (ref 80.0–100.0)
Platelets: 281 10*3/uL (ref 150–400)
RBC: 4.06 MIL/uL (ref 3.87–5.11)
RDW: 16.1 % — ABNORMAL HIGH (ref 11.5–15.5)
WBC: 10.3 10*3/uL (ref 4.0–10.5)
nRBC: 0 % (ref 0.0–0.2)

## 2023-01-30 LAB — HEPARIN LEVEL (UNFRACTIONATED)
Heparin Unfractionated: 0.34 [IU]/mL (ref 0.30–0.70)
Heparin Unfractionated: 0.41 [IU]/mL (ref 0.30–0.70)

## 2023-01-30 LAB — GLUCOSE, CAPILLARY
Glucose-Capillary: 170 mg/dL — ABNORMAL HIGH (ref 70–99)
Glucose-Capillary: 239 mg/dL — ABNORMAL HIGH (ref 70–99)
Glucose-Capillary: 191 mg/dL — ABNORMAL HIGH (ref 70–99)
Glucose-Capillary: 181 mg/dL — ABNORMAL HIGH (ref 70–99)

## 2023-01-30 LAB — BASIC METABOLIC PANEL
Anion gap: 8 (ref 5–15)
BUN: 8 mg/dL (ref 8–23)
CO2: 25 mmol/L (ref 22–32)
Calcium: 9 mg/dL (ref 8.9–10.3)
Chloride: 104 mmol/L (ref 98–111)
Creatinine, Ser: 0.78 mg/dL (ref 0.44–1.00)
GFR, Estimated: >60 mL/min (ref >60)
Glucose, Bld: 167 mg/dL — ABNORMAL HIGH (ref 70–99)
Potassium: 4.2 mmol/L (ref 3.5–5.1)
Sodium: 137 mmol/L (ref 135–145)

## 2023-01-30 LAB — POCT ACTIVATED CLOTTING TIME: Activated Clotting Time: 293 s

## 2023-01-30 SURGERY — LEFT HEART CATH AND CORONARY ANGIOGRAPHY
Anesthesia: LOCAL

## 2023-01-30 MED ORDER — FENTANYL CITRATE (PF) 100 MCG/2ML IJ SOLN
INTRAMUSCULAR | Status: DC | PRN
  Administered 2023-01-30 (×2): 25 ug via INTRAVENOUS

## 2023-01-30 MED ORDER — TICAGRELOR 90 MG PO TABS
ORAL_TABLET | ORAL | Status: AC
  Filled 2023-01-30: qty 1

## 2023-01-30 MED ORDER — HEPARIN SODIUM (PORCINE) 1000 UNIT/ML IJ SOLN
INTRAMUSCULAR | Status: AC
  Filled 2023-01-30: qty 10

## 2023-01-30 MED ORDER — TICAGRELOR 90 MG PO TABS
90.0000 mg | ORAL_TABLET | Freq: Two times a day (BID) | ORAL | Status: DC
  Administered 2023-01-30 – 2023-01-31 (×2): 90 mg via ORAL
  Filled 2023-01-30 (×2): qty 1

## 2023-01-30 MED ORDER — SPIRONOLACTONE 12.5 MG HALF TABLET
12.5000 mg | ORAL_TABLET | Freq: Every day | ORAL | Status: DC
  Administered 2023-01-30 – 2023-01-31 (×2): 12.5 mg via ORAL
  Filled 2023-01-30 (×2): qty 1

## 2023-01-30 MED ORDER — DAPAGLIFLOZIN PROPANEDIOL 10 MG PO TABS
10.0000 mg | ORAL_TABLET | Freq: Every day | ORAL | Status: DC
  Administered 2023-01-31: 10 mg via ORAL
  Filled 2023-01-30: qty 1

## 2023-01-30 MED ORDER — IOHEXOL 350 MG/ML SOLN
INTRAVENOUS | Status: DC | PRN
  Administered 2023-01-30: 100 mL

## 2023-01-30 MED ORDER — FENTANYL CITRATE (PF) 100 MCG/2ML IJ SOLN
INTRAMUSCULAR | Status: AC
  Filled 2023-01-30: qty 2

## 2023-01-30 MED ORDER — MIDAZOLAM HCL 2 MG/2ML IJ SOLN
INTRAMUSCULAR | Status: AC
  Filled 2023-01-30: qty 2

## 2023-01-30 MED ORDER — TICAGRELOR 90 MG PO TABS
ORAL_TABLET | ORAL | Status: DC | PRN
  Administered 2023-01-30: 180 mg via ORAL

## 2023-01-30 MED ORDER — ATORVASTATIN CALCIUM 40 MG PO TABS
40.0000 mg | ORAL_TABLET | Freq: Every day | ORAL | Status: DC
  Administered 2023-01-30 – 2023-01-31 (×2): 40 mg via ORAL
  Filled 2023-01-30 (×2): qty 1

## 2023-01-30 MED ORDER — VERAPAMIL HCL 2.5 MG/ML IV SOLN
INTRAVENOUS | Status: DC | PRN
  Administered 2023-01-30: 10 mL via INTRA_ARTERIAL

## 2023-01-30 MED ORDER — MIDAZOLAM HCL 2 MG/2ML IJ SOLN
INTRAMUSCULAR | Status: DC | PRN
  Administered 2023-01-30 (×2): 1 mg via INTRAVENOUS

## 2023-01-30 MED ORDER — LIDOCAINE HCL (PF) 1 % IJ SOLN
INTRAMUSCULAR | Status: AC
  Filled 2023-01-30: qty 30

## 2023-01-30 MED ORDER — VERAPAMIL HCL 2.5 MG/ML IV SOLN
INTRAVENOUS | Status: AC
  Filled 2023-01-30: qty 2

## 2023-01-30 MED ORDER — NITROGLYCERIN 1 MG/10 ML FOR IR/CATH LAB
INTRA_ARTERIAL | Status: AC
  Filled 2023-01-30: qty 10

## 2023-01-30 MED ORDER — EMPAGLIFLOZIN 10 MG PO TABS
10.0000 mg | ORAL_TABLET | Freq: Every day | ORAL | Status: DC
  Administered 2023-01-30: 10 mg via ORAL
  Filled 2023-01-30: qty 1

## 2023-01-30 MED ORDER — HEPARIN (PORCINE) IN NACL 2000-0.9 UNIT/L-% IV SOLN
INTRAVENOUS | Status: DC | PRN
  Administered 2023-01-30: 1000 mL

## 2023-01-30 MED ORDER — HEPARIN SODIUM (PORCINE) 1000 UNIT/ML IJ SOLN
INTRAMUSCULAR | Status: DC | PRN
  Administered 2023-01-30 (×2): 5000 [IU] via INTRAVENOUS

## 2023-01-30 MED ORDER — TICAGRELOR 90 MG PO TABS
90.0000 mg | ORAL_TABLET | Freq: Two times a day (BID) | ORAL | 11 refills | Status: DC
  Filled 2023-01-30: qty 60, 30d supply, fill #0

## 2023-01-30 MED ORDER — LIDOCAINE HCL (PF) 1 % IJ SOLN
INTRAMUSCULAR | Status: DC | PRN
  Administered 2023-01-30: 2 mL via INTRADERMAL

## 2023-01-30 MED ORDER — IRBESARTAN 150 MG PO TABS
150.0000 mg | ORAL_TABLET | Freq: Every day | ORAL | Status: DC
  Administered 2023-01-31: 150 mg via ORAL
  Filled 2023-01-30: qty 1

## 2023-01-30 SURGICAL SUPPLY — 24 items
BALLN SAPPHIRE 2.0X12 (BALLOONS) ×1
BALLN SAPPHIRE 2.0X20 (BALLOONS) ×1
BALLN ~~LOC~~ EMERGE MR 2.5X20 (BALLOONS) ×1
BALLOON SAPPHIRE 2.0X12 (BALLOONS) IMPLANT
BALLOON SAPPHIRE 2.0X20 (BALLOONS) IMPLANT
BALLOON TAKERU 1.5X12 (BALLOONS) IMPLANT
BALLOON ~~LOC~~ EMERGE MR 2.5X20 (BALLOONS) IMPLANT
CATH INFINITI AMBI 5FR TG (CATHETERS) IMPLANT
CATH INFINITI JR4 5F (CATHETERS) IMPLANT
CATH LAUNCHER 6FR EBU3.5 (CATHETERS) IMPLANT
DEVICE RAD COMP TR BAND LRG (VASCULAR PRODUCTS) IMPLANT
GLIDESHEATH SLEND SS 6F .021 (SHEATH) IMPLANT
GUIDEWIRE INQWIRE 1.5J.035X260 (WIRE) IMPLANT
INQWIRE 1.5J .035X260CM (WIRE) ×1
KIT ENCORE 26 ADVANTAGE (KITS) IMPLANT
KIT SYRINGE INJ CVI SPIKEX1 (MISCELLANEOUS) IMPLANT
MAT PREVALON FULL STRYKER (MISCELLANEOUS) IMPLANT
PACK CARDIAC CATHETERIZATION (CUSTOM PROCEDURE TRAY) ×1 IMPLANT
SET ATX-X65L (MISCELLANEOUS) IMPLANT
SHEATH PROBE COVER 6X72 (BAG) IMPLANT
STENT SYNERGY XD 2.25X24 (Permanent Stent) IMPLANT
SYNERGY XD 2.25X24 (Permanent Stent) ×1 IMPLANT
WIRE ASAHI PROWATER 180CM (WIRE) IMPLANT
WIRE RUNTHROUGH .014X180CM (WIRE) IMPLANT

## 2023-01-30 NOTE — TOC Benefit Eligibility Note (Addendum)
Patient Product/process development scientist completed.    The patient is insured through [employer]. Patient has Medicare and is not eligible for a copay card, but may be able to apply for patient assistance, if available.    Ran test claim for Brilinta 90 mg and the current 30 day co-pay is $110.92 due to being in Coverage Gap (donut hole).  Ran test claim for Jardiance 10 mg and the current 30 day co-pay is $150.25 due to being in Coverage Gap (donut hole).  Ran test claim for Farixga  10 mg and the current 30 day co-pay is $143.16 due to being in Coverage Gap (donut hole).  This test claim was processed through [facility]- copay amounts may vary at other pharmacies due to pharmacy/plan contracts, or as the patient moves through the different stages of their insurance plan.     Roland Earl, CPHT Pharmacy Technician III Certified Patient Advocate [facility] Patient Advocate Team Direct Number: [phone]  Fax: [phone]

## 2023-01-30 NOTE — Interval H&P Note (Signed)
History and Physical Interval Note:  01/30/2023 7:43 AM  Leslie [name]  has presented today for surgery, with the diagnosis of unstable angina.  The various methods of treatment have been discussed with the patient and family. After consideration of risks, benefits and other options for treatment, the patient has consented to  Procedure(s): LEFT HEART CATH AND CORONARY ANGIOGRAPHY (N/A)  PERCUTANEOUS CORONARY INTERVENTION   as a surgical intervention.  The patient's history has been reviewed, patient examined, no change in status, stable for surgery.  I have reviewed the patient's chart and labs.  Questions were answered to the patient's satisfaction.    Cath Lab Visit (complete for each Cath Lab visit)  Clinical Evaluation Leading to the Procedure:   ACS: Yes.    Non-ACS:    Anginal Classification: CCS III  Anti-ischemic medical therapy: Minimal Therapy (1 class of medications)  Non-Invasive Test Results: No non-invasive testing performed  Prior CABG: No previous CABG   Bryan Lemma

## 2023-01-30 NOTE — Progress Notes (Addendum)
Heart Failure Stewardship Pharmacist Progress Note   PCP: Ignatius Specking, MD PCP-Cardiologist: None    HPI:  [id number] initially presenting to [facility] ED on 10/25 with CP - when walking from her car to her home, radiated to her shoulders, heaviness in her arms. PMH includes asthma, T2DM, HTN, HLD, obesity.  Pt reports that she has been off ASA for several weeks, noted persistent edema in her LE for the past 2 days. Tp 9 > 46. Initial EKG without significant ST segment or T wave changes, LBBB block. Echo 10/28 with EF 35-40%, LV moderately decreased function, global hypokinesis, RV normal. Transferred to [facility] for LHC. Troponin continued to increase. LHC on 10/28 demonstrated 80% stenosis in mid-LAD treated with DES and 70% stenosis in 2nd diagonal treated with balloon angioplasty. Otherwise minimal CAD. LVEDP mildly elevated to 25. Borderline hypotensive during cath - recommending holding ARB x1 day. Plan to treat with DAPT for NSTEMI and GDMT for HF.   Upon interview, pt reports that she is doing well post-catheterization/PCI. Chest pain is resolved, but she does report that she has a new feeling of intermittent ShOB, where she feels like she has to pause and take a deep breath. Cardiologist aware. Pt and family are concerned about drug costs since pt is in the donut hole. Believe they would be eligible for patient assistance based on income cut-offs. Pt denies missed doses of medications prior to admission. Uses a pill box to organize her medications.   Current HF Medications: Beta Blocker: metoprolol succinate 100 mg PO daily ACE/ARB/ARNI: irbesartan 150 mg PO daily MRA: spironolactone 12.5 mg PO daily SGLT2i: dapagliflozin (Farxiga) 10 mg PO daily  Prior to admission HF Medications: Beta blocker: metoprolol succinate 50 mg PO BID - pt unsure why she is taking BID ACE/ARB/ARNI: candesartan 16 mg PO daily Other: amlodipine 5 mg PO daily  Pertinent Lab Values: Serum creatinine 0.78, BUN 8,  Potassium 4.2, Sodium 137, A1c 6.9%, Tp 9 > 45 > 109 > 140  Vital Signs: Weight: 210 lbs (admission weight: 215.6 lbs) Blood pressure: 120-130s/50-60s  Heart rate: 80-90s  I/O: not documented yesterday; net -0.1L since admission (possibly not well documeted)  Medication Assistance / Insurance Benefits Check: Does the patient have prescription insurance?  Yes Type of insurance plan: [employer] Medicare  Test claims:  Ran test claim for Jardiance 10 mg and the current 30 day co-pay is $150.25 due to being in Coverage Gap (donut hole).   Ran test claim for Farixga  10 mg and the current 30 day co-pay is $143.16 due to being in Coverage Gap (donut hole).  Ran test claim for Brilinta 90 mg and the current 30 day co-pay is $110.92 due to being in Coverage Gap (donut hole).   Does the patient qualify for medication assistance through manufacturers or grants?   Yes Eligible grants and/or patient assistance programs: Cathren Harsh (if added) Medication assistance applications in progress: [facility]  Medication assistance applications approved: n/a Approved medication assistance renewals will be completed by: [facility]  Outpatient Pharmacy:  Prior to admission outpatient pharmacy: [facility] Is the patient willing to use [facility] TOC pharmacy at discharge? Yes Is the patient willing to transition their outpatient pharmacy to utilize a [facility] outpatient pharmacy?   No    Assessment: 1. New onset systolic CHF (LVEF 35-40%), due to ischemic cardiomyopathy, in the setting of NSTEMI. NYHA class II-III symptoms. - Pt stable post PCI. BP appears to be elevated post-catheterization, after holding  dose of ARB. Pt has cough documented with lisinopril, but no known C/I to sacubitril/valsartan. Could consider initiating ARNI tomorrow instead of restarting ARB. Scr stable and at baseline.  - LHC demonstrated mildly elevated LVEDP - pt has not received and diuresis. MRA added today, which may  also help reduce fluid accumulation in addition to SGLT2i. LE edema improved on exam.  - Agree with discontinuation of amlodipine to allow for further titration of GDMT and reduce LE edema.  - Pt is likely eligible for patient assistance programs for branded medications   Plan: 1) Medication changes recommended at this time: - Continue metoprolol succinate 100 mg PO daily  - Continue spironolactone 12.5 mg PO daily - Continue dapagliflozin (Farxiga) 10 mg PO daily - Continue irbesartan 150 mg PO daily. Consider transitioning to Entresto (sacubitril-valsartan) 24-26 mg PO BID, as we could start patient assistance application.   2) Patient assistance: - Obtained signature for [place] application for Brillinta, Farxiga  3)  Education  - Education complete including importance of adherence to daily medications to improve heart function and prevent HF exacerbations.  - Educated pt on dosing and administration of new medication, Farxiga.  - Pt aware that medications will  be adjusted and titrated during admission and at outpatient follow-up.   Leslie [name], PharmD PGY1 Pharmacy Resident

## 2023-01-30 NOTE — Progress Notes (Signed)
CARDIAC REHAB PHASE I     Post MI/stent education including restrictions, risk factors, exercise guidelines, antiplatelet therapy importance, MI booklet, NTG use, heart healthy diabetic diet  and CRP2 reviewed. All questions and concerns addressed. Will refer to [facility] for CRP2. Plans for discharge later today.   [id number] Woodroe Chen, RN BSN 01/30/2023 10:47 AM

## 2023-01-30 NOTE — Progress Notes (Signed)
   01/30/23 1100  Spiritual Encounters  Type of Visit Initial  Care provided to: Pt and family  Conversation partners present during encounter Nurse  Referral source Patient request  Reason for visit Advance directives  OnCall Visit No   Ch responded to request for AD. Pt's family was present at bedside. Ch assisted pt with AD education. Pt will page chaplain when she is ready. No follow-up needed at this time.

## 2023-01-30 NOTE — Plan of Care (Signed)
  Problem: Education: Goal: Ability to describe self-care measures that may prevent or decrease complications (Diabetes Survival Skills Education) will improve Outcome: Progressing Goal: Individualized Educational Video(s) Outcome: Progressing   Problem: Coping: Goal: Ability to adjust to condition or change in health will improve Outcome: Progressing   Problem: Fluid Volume: Goal: Ability to maintain a balanced intake and output will improve Outcome: Progressing   Problem: Health Behavior/Discharge Planning: Goal: Ability to identify and utilize available resources and services will improve Outcome: Progressing Goal: Ability to manage health-related needs will improve Outcome: Progressing   Problem: Metabolic: Goal: Ability to maintain appropriate glucose levels will improve Outcome: Progressing   Problem: Nutritional: Goal: Maintenance of adequate nutrition will improve Outcome: Progressing Goal: Progress toward achieving an optimal weight will improve Outcome: Progressing   Problem: Skin Integrity: Goal: Risk for impaired skin integrity will decrease Outcome: Progressing   Problem: Tissue Perfusion: Goal: Adequacy of tissue perfusion will improve Outcome: Progressing   Problem: Education: Goal: Understanding of cardiac disease, CV risk reduction, and recovery process will improve Outcome: Progressing Goal: Individualized Educational Video(s) Outcome: Progressing   Problem: Activity: Goal: Ability to tolerate increased activity will improve Outcome: Progressing   Problem: Cardiac: Goal: Ability to achieve and maintain adequate cardiovascular perfusion will improve Outcome: Progressing   Problem: Health Behavior/Discharge Planning: Goal: Ability to safely manage health-related needs after discharge will improve Outcome: Progressing   Problem: Education: Goal: Knowledge of General Education information will improve Description: Including pain rating scale,  medication(s)/side effects and non-pharmacologic comfort measures Outcome: Progressing   Problem: Health Behavior/Discharge Planning: Goal: Ability to manage health-related needs will improve Outcome: Progressing   Problem: Clinical Measurements: Goal: Ability to maintain clinical measurements within normal limits will improve Outcome: Progressing Goal: Will remain free from infection Outcome: Progressing Goal: Diagnostic test results will improve Outcome: Progressing Goal: Respiratory complications will improve Outcome: Progressing Goal: Cardiovascular complication will be avoided Outcome: Progressing   Problem: Activity: Goal: Risk for activity intolerance will decrease Outcome: Progressing   Problem: Nutrition: Goal: Adequate nutrition will be maintained Outcome: Progressing   Problem: Coping: Goal: Level of anxiety will decrease Outcome: Progressing   Problem: Elimination: Goal: Will not experience complications related to bowel motility Outcome: Progressing Goal: Will not experience complications related to urinary retention Outcome: Progressing   Problem: Pain Management: Goal: General experience of comfort will improve Outcome: Progressing   Problem: Safety: Goal: Ability to remain free from injury will improve Outcome: Progressing   Problem: Skin Integrity: Goal: Risk for impaired skin integrity will decrease Outcome: Progressing   Problem: Education: Goal: Understanding of CV disease, CV risk reduction, and recovery process will improve Outcome: Progressing Goal: Individualized Educational Video(s) Outcome: Progressing   Problem: Activity: Goal: Ability to return to baseline activity level will improve Outcome: Progressing   Problem: Cardiovascular: Goal: Ability to achieve and maintain adequate cardiovascular perfusion will improve Outcome: Progressing Goal: Vascular access site(s) Level 0-1 will be maintained Outcome: Progressing   Problem:  Health Behavior/Discharge Planning: Goal: Ability to safely manage health-related needs after discharge will improve Outcome: Progressing

## 2023-01-30 NOTE — Progress Notes (Addendum)
Rounding Note    Patient Name: Leslie [name] Date of Encounter: 01/30/2023  [facility] Cardiologist: Nicki Guadalajara, MD   Subjective   I saw patient after she got back from cardiac catheterization. She is doing well. No chest pain. She feels a little short of breath but looks like she is breathing comfortably.  Inpatient Medications    Scheduled Meds:  aspirin EC  81 mg Oral Daily   atorvastatin  40 mg Oral Daily   dapagliflozin propanediol  10 mg Oral Daily   fluticasone furoate-vilanterol  1 puff Inhalation Daily   influenza vaccine adjuvanted  0.5 mL Intramuscular Tomorrow-1000   insulin aspart  0-15 Units Subcutaneous TID WC   insulin aspart  0-5 Units Subcutaneous QHS   [START ON 01/31/2023] irbesartan  150 mg Oral Daily   loratadine  10 mg Oral Daily   metoprolol succinate  100 mg Oral Daily   nitroGLYCERIN       pantoprazole  40 mg Oral Daily   spironolactone  12.5 mg Oral Daily   ticagrelor  90 mg Oral BID   Continuous Infusions:   PRN Meds: acetaminophen, nitroGLYCERIN, ondansetron (ZOFRAN) IV   Vital Signs    Vitals:   01/30/23 1003 01/30/23 1018 01/30/23 1033 01/30/23 1048  BP: 116/73 137/61 135/61 (!) 149/79  Pulse: 81 84 86 86  Resp:      Temp:      TempSrc:      SpO2: 99% 99% 99% 100%  Weight:      Height:        Intake/Output Summary (Last 24 hours) at 01/30/2023 1104 Last data filed at 01/30/2023 4098 Gross per 24 hour  Intake 580.49 ml  Output 850 ml  Net -269.51 ml      01/30/2023    4:29 AM 01/28/2023    2:53 PM 01/28/2023    1:45 AM  Last 3 Weights  Weight (lbs) 210 lb 212 lb 4.9 oz 215 lb 9.8 oz  Weight (kg) 95.255 kg 96.3 kg 97.8 kg      Telemetry    Sinus rhythm with rates in the 70s to 90s. - Personally Reviewed  ECG    Normal sinus rhythm, rate 87 bpm, with LBBB.  - Personally Reviewed  Physical Exam   GEN: Obese Caucasian female resting comfortably in no acute distress.   Neck: JVD difficult to assess  due to body habitus. Cardiac: RRR. No murmurs, rubs, or gallops.  TR band on right wrist. Respiratory: Clear to auscultation bilaterally. No wheezes, rhonchi, or rales. GI: Soft, non-distended, and non-tender. MS: No lower extremity edema. No deformity. Skin: Warm and dry. Neuro:  No focal deficits.  Psych: Normal affect. Responds appropriately.  Labs    High Sensitivity Troponin:   Recent Labs  Lab 01/27/23 2106 01/27/23 2300 01/28/23 0218 01/28/23 0451  TROPONINIHS 9 46* 109* 140*     Chemistry Recent Labs  Lab 01/27/23 2106 01/29/23 0513 01/30/23 0711  NA 138 138 137  K 4.1 4.2 4.2  CL 104 103 104  CO2 22 23 25   GLUCOSE 112* 131* 167*  BUN 10 12 8   CREATININE 0.85 0.86 0.78  CALCIUM 9.4 9.1 9.0  GFRNONAA >60 >60 >60  ANIONGAP 12 12 8     Lipids  Recent Labs  Lab 01/29/23 0513  CHOL 123  TRIG 190*  HDL 43  LDLCALC 42  CHOLHDL 2.9    Hematology Recent Labs  Lab 01/28/23 0451 01/29/23 0513 01/30/23 0711  WBC  13.3* 11.4* 10.3  RBC 4.16 4.29 4.06  HGB 11.6* 11.5* 10.7*  HCT 36.2 36.1 34.6*  MCV 87.0 84.1 85.2  MCH 27.9 26.8 26.4  MCHC 32.0 31.9 30.9  RDW 16.1* 16.1* 16.1*  PLT 312 296 281   Thyroid No results for input(s): "TSH", "FREET4" in the last 168 hours.  BNPNo results for input(s): "BNP", "PROBNP" in the last 168 hours.  DDimer No results for input(s): "DDIMER" in the last 168 hours.   Radiology    CARDIAC CATHETERIZATION  Result Date: 01/30/2023   Mid LAD lesion is 80% stenosed with 70% stenosed side branch in 2nd Diag.   Balloon angioplasty was performed on the 2nd Diag ostium, using a BALLOON TAKERU 1.5X12.  Post intervention, the side branch was reduced to 55% residual stenosis.   A drug-eluting stent was successfully placed using a SYNERGY XD 2.25X2 -> deployed to 2.4 mm, postdilated proximally to 2.6 mm. Post intervention, there is a 0% residual stenosis.   --------------------------------------   Otherwise minimal CAD in the  remainder of the coronary system.   --------------------------------------   LV end diastolic pressure is mildly elevated.   There is no aortic valve stenosis. POST-CATH DIAGNOSES Single-vessel CAD with extensive LAD calcification and a segmental 70-85% stenosis in the mid LAD beginning at and involving ostium of small caliber 2nd Diag. Successful bifurcation PCI of the LAD and 2nd Diag using a Synergy XD DES 2.25 mm x 24 mm deployed to 2.4 mm and postdilated proximally to 2.6 mm-reducing the lesion segment to 0% PTCA of ostial 2nd Diag sidebranch with 1.0 mm balloon to maintain flow Otherwise large caliber codominant LCx that terminates as a large posterolateral OM branch and large caliber RCA that terminates as a posterior descending artery with a small PL branch. Reduced EF by echo, mildly increased LVEDP. RECOMMENDATIONS In the absence of any other complications or medical issues, we expect the patient to be ready for discharge from an interventional cardiology perspective on 01/30/2023. Recommend uninterrupted dual antiplatelet therapy with Aspirin 81mg  daily and Clopidogrel 75mg  daily for a minimum of 12 months (ACS-Class I recommendation). Would convert to Brilinta 60 mg twice daily or Plavix 75 mg daily SAPT after 1 year. For urgent procedures, could potentially hold DAPT temporarily at 6 months. For significant bleeding or bruising, could stop aspirin at 3-6 months if remains on Brilinta. I have held her ARB dose for today since she is borderline hypotensive in the Cath Lab.  Can consider restarting tomorrow depending on blood pressure assessment prior to discharge. Otherwise continue to titrate CAD GDMT as tolerated. Bryan Lemma, MD   ECHOCARDIOGRAM COMPLETE  Result Date: 01/29/2023    ECHOCARDIOGRAM REPORT   Patient Name:   Leslie [name] Date of Exam: 01/29/2023 Medical Rec #:  [id number]    Height:       63.0 in Accession #:    [id number]   Weight:       212.3 lb Date of Birth:  1955-08-30    BSA:           1.983 m Patient Age:    67 years     BP:           131/61 mmHg Patient Gender: F            HR:           93 bpm. Exam Location:  Inpatient Procedure: 2D Echo, Cardiac Doppler, Color Doppler and Intracardiac  Opacification Agent Indications:    Chest Pain R07.9  History:        Patient has no prior history of Echocardiogram examinations.                 Signs/Symptoms:Chest Pain; Risk Factors:Diabetes, Sleep Apnea,                 Hypertension and Former Smoker.  Sonographer:    Dondra Prader RVT RCS Referring Phys: [id number] Ellsworth Lennox  Sonographer Comments: Technically difficult study due to poor echo windows, suboptimal parasternal window, suboptimal apical window, suboptimal subcostal window and patient is obese. Image acquisition challenging due to patient body habitus. IMPRESSIONS  1. Significant LV dyssynchrony noted due to LBBB. Left ventricular ejection fraction, by estimation, is 35 to 40%. The left ventricle has moderately decreased function. The left ventricle demonstrates global hypokinesis. Indeterminate diastolic filling due to E-A fusion.  2. Right ventricular systolic function is normal. The right ventricular size is normal. Tricuspid regurgitation signal is inadequate for assessing PA pressure.  3. The mitral valve is grossly normal. Trivial mitral valve regurgitation. No evidence of mitral stenosis.  4. The aortic valve is tricuspid. There is mild calcification of the aortic valve. There is mild thickening of the aortic valve. Aortic valve regurgitation is not visualized. Aortic valve sclerosis is present, with no evidence of aortic valve stenosis.  5. The inferior vena cava is normal in size with greater than 50% respiratory variability, suggesting right atrial pressure of 3 mmHg. FINDINGS  Left Ventricle: Significant LV dyssynchrony noted due to LBBB. Left ventricular ejection fraction, by estimation, is 35 to 40%. The left ventricle has moderately decreased function. The  left ventricle demonstrates global hypokinesis. Definity contrast agent was given IV to delineate the left ventricular endocardial borders. The left ventricular internal cavity size was normal in size. There is no left ventricular hypertrophy. Abnormal (paradoxical) septal motion, consistent with left bundle branch block. Indeterminate diastolic filling due to E-A fusion. Right Ventricle: The right ventricular size is normal. No increase in right ventricular wall thickness. Right ventricular systolic function is normal. Tricuspid regurgitation signal is inadequate for assessing PA pressure. Left Atrium: Left atrial size was normal in size. Right Atrium: Right atrial size was normal in size. Pericardium: There is no evidence of pericardial effusion. Presence of epicardial fat layer. Mitral Valve: The mitral valve is grossly normal. Trivial mitral valve regurgitation. No evidence of mitral valve stenosis. Tricuspid Valve: The tricuspid valve is grossly normal. Tricuspid valve regurgitation is trivial. No evidence of tricuspid stenosis. Aortic Valve: The aortic valve is tricuspid. There is mild calcification of the aortic valve. There is mild thickening of the aortic valve. Aortic valve regurgitation is not visualized. Aortic valve sclerosis is present, with no evidence of aortic valve stenosis. Aortic valve mean gradient measures 4.7 mmHg. Aortic valve peak gradient measures 8.1 mmHg. Aortic valve area, by VTI measures 1.92 cm. Pulmonic Valve: The pulmonic valve was grossly normal. Pulmonic valve regurgitation is not visualized. No evidence of pulmonic stenosis. Aorta: The aortic root and ascending aorta are structurally normal, with no evidence of dilitation. Venous: The inferior vena cava is normal in size with greater than 50% respiratory variability, suggesting right atrial pressure of 3 mmHg. IAS/Shunts: The atrial septum is grossly normal.  LEFT VENTRICLE PLAX 2D LVIDd:         5.10 cm   Diastology LVIDs:          3.70 cm   LV e' medial:  3.81 cm/s LV PW:         1.10 cm   LV E/e' medial:  21.6 LV IVS:        1.00 cm   LV e' lateral:   8.35 cm/s LVOT diam:     1.90 cm   LV E/e' lateral: 9.9 LV SV:         54 LV SV Index:   27 LVOT Area:     2.84 cm  RIGHT VENTRICLE             IVC RV S prime:     13.80 cm/s  IVC diam: 2.00 cm TAPSE (M-mode): 2.0 cm LEFT ATRIUM             Index        RIGHT ATRIUM           Index LA diam:        2.90 cm 1.46 cm/m   RA Area:     12.20 cm LA Vol (A2C):   29.3 ml 14.75 ml/m  RA Volume:   30.40 ml  15.33 ml/m LA Vol (A4C):   33.2 ml 16.74 ml/m LA Biplane Vol: 38.3 ml 19.31 ml/m  AORTIC VALVE                    PULMONIC VALVE AV Area (Vmax):    1.99 cm     PV Vmax:       1.00 m/s AV Area (Vmean):   1.96 cm     PV Peak grad:  4.0 mmHg AV Area (VTI):     1.92 cm AV Vmax:           142.67 cm/s AV Vmean:          97.200 cm/s AV VTI:            0.279 m AV Peak Grad:      8.1 mmHg AV Mean Grad:      4.7 mmHg LVOT Vmax:         100.20 cm/s LVOT Vmean:        67.267 cm/s LVOT VTI:          0.189 m LVOT/AV VTI ratio: 0.68  AORTA Ao Root diam: 2.95 cm Ao Asc diam:  2.90 cm MITRAL VALVE MV Area (PHT): 5.11 cm    SHUNTS MV Decel Time: 149 msec    Systemic VTI:  0.19 m MV E velocity: 82.30 cm/s  Systemic Diam: 1.90 cm MV A velocity: 92.50 cm/s MV E/A ratio:  0.89 Lennie Odor MD Electronically signed by Lennie Odor MD Signature Date/Time: 01/29/2023/4:08:05 PM    Final     Cardiac Studies   Echocardiogram 01/29/2023: Impressions: 1. Significant LV dyssynchrony noted due to LBBB. Left ventricular  ejection fraction, by estimation, is 35 to 40%. The left ventricle has  moderately decreased function. The left ventricle demonstrates global  hypokinesis. Indeterminate diastolic filling  due to E-A fusion.   2. Right ventricular systolic function is normal. The right ventricular  size is normal. Tricuspid regurgitation signal is inadequate for assessing  PA pressure.   3. The mitral  valve is grossly normal. Trivial mitral valve  regurgitation. No evidence of mitral stenosis.   4. The aortic valve is tricuspid. There is mild calcification of the  aortic valve. There is mild thickening of the aortic valve. Aortic valve  regurgitation is not visualized. Aortic valve sclerosis is present, with  no evidence of aortic valve stenosis.  5. The inferior vena cava is normal in size with greater than 50%  respiratory variability, suggesting right atrial pressure of 3 mmHg.    Patient Profile     67 y.o. female with a history of hypertension, hyperlipidemia, type 2 diabetes mellitus, and GERD who presented to the [facility] ED on 01/27/2023 for chest pain that radiated to arms, back, and jaw. EKG showed normal sinus rhythm with new LBBB. High-sensitivity troponin mildly elevated. Therefore, she was transferred to [facility] for further evaluation.  Assessment & Plan    NSTEMI CAD Patient presented with sudden onset of chest pain that radiated to arms, back, and jaw. EKG showed new LBBB. High-sensitivity troponin 9 >> 45 >> 109 >> 140. Echo showed LVEF of 35-40% with significant LV dyssynchrony due to LBBB. LHC showed 80% stenosis of mid LAD and 70% stenosis of side branch in 2nd Diag. She underwent successful PCI with DES to LAD and balloon angioplasty to 2nd Diag. - No chest pain.  - Continue Toprol-XL 100mg  dailiy.  - Continue Aspirin 81mg  daily.  - Will stop Simvastatin and start Lipitor 40mg  daily.  Newly Diagnosed Cardiomyopathy Echo this admission showed LVEF of 35-40% with global hypokinesis but significant LV dyssynchrony due to LBBB. LVEDP mildly elevated at 19 mmHg. - Euvolemic on exam.  - Continue Irbesartan 150mg  daily (on Candesartan at home).  - Continue Toprol-XL 100mg  daily.  - Will start Spironolactone 12.5mg  daily and Farxiga 10mg  daily.  - Considered switching ARB to [facility] but she is already in the donut hole and will be going home on Brilinta.  Therefore, due to concern for cost, will leave on ARB for now. - Will need repeat Echo in about 3 months to reassess LV function after revascularization and optimization of GDMT.  Hypertension BP well controlled.  - Continue Irbesartan 150mg  daily (On Candesartan at home). - Continue Toprol-XL 100mg  daily. - Will stop home Amlodipine and start Spironolactone 12.5mg  daily.  Hyperlipidemia Lipid panel this admission: Total Cholesterol 123, Triglycerides 190, HDL 43, LDL 42.  - Will stop Simvastatin and start Lipitor 40mg  daily in favor of a high-intensity statin. - Will need repeat lipid panel and LFTs in 6-8 weeks.  Type 2 Diabetes Mellitus Hemoglobin A1c this admission 6.9%. - On Metformin, Januvia, and Glipizide at home but on sliding scale insulin here. - Will start Farxiga 10mg  daily. - Management per primary team.  For questions or updates, please contact [facility] Please consult [email or web address] for contact info under        Signed, Corrin Parker, PA-C  01/30/2023, 11:04 AM     Patient seen and examined. Agree with assessment and plan.  Patient feels well.  I reviewed the angiographic findings noted at catheterization.  She underwent stenting of her LAD with PTCA of the ostium of the jailed diagonal vessel.  EF on echo was reduced to 35 to 40%.  Anticipate improvement in LV function with successful revascularization as well as initiation of guideline directed medical therapy.  As noted above, Jardiance and spironolactone will be initiated today.  Patient will continue ARB therapy with ultimate plan to transition to [facility] as outpatient.  Continue metoprolol succinate 100 mg daily.  With medication adjustments, plan to keep patient in hospital following her intervention this morning with anticipated discharge tomorrow.                   Lennette Bihari, MD, [facility] 01/30/2023 11:39 AM

## 2023-01-30 NOTE — Progress Notes (Signed)
PROGRESS NOTE    Leslie [name]  NFA:[id number] DOB: [birth year] DOA: 01/27/2023 PCP: Ignatius Specking, MD    Brief Narrative:   Leslie [name] is a 67 y.o. female with past medical history significant for asthma, type 2 diabetes mellitus, HTN, HLD, obesity who presented to [facility] ED on 10/25 with chest pain. Reported intermittent precordial chest pain, after walking from her car to her home, pressure in nature and radiated to her shoulders, and associated with heaviness in her arms. Leslie [name] has been of aspirin for several weeks and has noted persistent edema at her lower extremities for the last 2 days.   In the ED, temperature 98.5 F, HR 96, RR 22, BP 155/67, SpO2 100% on room air.  WBC 14.0, hemoglobin 12.1, platelet count 313.  Sodium 138, potassium 4.1, chloride 104, CO2 22, glucose 112, BUN 10, creatinine 0.85.  High sensitive troponin 9 followed by 46.  Chest x-ray with minimal lateral atelectasis versus scarring in the left midlung.  EKG 94 bpm, normal axis, left bundle branch block, sinus rhythm with left atrial enlargement, poor R R wave progression with no significant ST segment or T wave changes. (Left bundle new from 2017).  Leslie [name] was placed on heparin drip for anticoagulation.  Cardiology was consulted; who recommended transfer to [facility] for further cardiac evaluation with anticipated need of left heart catheterization.  Assessment & Plan:   CAD s/p PCI/DES to LAD NSTEMI Leslie [name] presenting to ED with chest pain.  Noted elevated troponin.  Cardiology was consulted and Leslie [name] was transferred to [facility] for further cardiac evaluation.  Leslie [name] went left heart catheterization on 10/28 with findings of 80% stenosis mid LAD and 70% stenosis of sidebranch of second diagonal s/p PCI/DES LAD and balloon angioplasty to second diagonal. -- Cardiology following, appreciate assistance -- hs troponin 9>46>109>140 -- Metoprolol succinate 100 mg p.o. daily -- Brilinta 90 mg p.o.  twice daily -- Aspirin 81 mg p.o. daily -- Discontinue home simvastatin in favor of atorvastatin 40 mg p.o. daily -- Monitor on telemetry  Essential Hypertension Cardiomyopathy, newly diagnosed TTE with LVEF 35-40% with global hypokinesis.  Underwent left heart catheterization 10/28 with revascularization/stenting LAD and balloon angioplasty to second diagonal. -- Home amlodipine discontinued -- Metoprolol succinate 100 mg p.o. daily -- Irbesartan 150 mg p.o. daily -- Starting spironolactone 12.5 mg p.o. daily -- Farxiga 10 mg p.o. daily -- Repeat TTE 3 months to reassess LV function after revascularization  Type 2 diabetes mellitus Home regimen includes glipizide 10 mg p.o. every morning, 5 mg every afternoon, metformin 1000 mg every morning, 5 mg every afternoon, Januvia.  Hemoglobin A1c 6.9 on 01/28/2023, well-controlled. -- Hold oral hypoglycemics while inpatient --Cardiology starting Farxiga today -- SSI for coverage -- CBGs qAC/HS  Dyslipidemia Lipid panel with total cholesterol 123, HDL 43, LDL 42, triglycerides 190. -- Home simvastatin discontinued in favor of atorvastatin 4 mg p.o. daily -- Repeat lipid panel/LFTs 6/8 weeks  Asthma -- Breo Ellipta 1 puff daily  GERD -- Protonix 40 mg p.o. daily (substituted for home omeprazole)  Obesity Body mass index is 37.2 kg/m.  Complicates all facets of care    DVT prophylaxis: SCDs Start: 01/28/23 0409    Code Status: Full Code Family Communication: No family present at bedside this morning  Disposition Plan:  Level of care: Telemetry Cardiac Status is: Inpatient Remains inpatient appropriate because: Left heart catheterization today, cardiology adjusting medications given new onset cardiomyopathy and receiving stent to LAD today, anticipate discharge home  tomorrow    Consultants:  Cardiology  Procedures:  TTE:  Antimicrobials:  None   Subjective: Leslie [name] seen examined bedside, resting company.  Lying in  bed.  Going down to Cath Lab this morning.  Left heart catheterization notable for stenosis to LAD and second marginal underwent stent placement and angioplasty.  Started on Brilinta, Farxiga, spironolactone by cardiology today.  No questions or concerns at this time.  Denies headache, no dizziness, no chest pain, no palpitations, no shortness of breath, no abdominal pain, no fever/chills/night sweats, no nausea/vomiting/diarrhea, no focal weakness, no fatigue, no paresthesias.  No acute events overnight per nursing staff.  Objective: Vitals:   01/30/23 1033 01/30/23 1048 01/30/23 1129 01/30/23 1145  BP: 135/61 (!) 149/79 (!) 140/110 126/68  Pulse: 86 86 90   Resp:      Temp:      TempSrc:      SpO2: 99% 100% 99%   Weight:      Height:        Intake/Output Summary (Last 24 hours) at 01/30/2023 1152 Last data filed at 01/30/2023 2831 Gross per 24 hour  Intake 580.49 ml  Output 850 ml  Net -269.51 ml   Filed Weights   01/28/23 0145 01/28/23 1453 01/30/23 0429  Weight: 97.8 kg 96.3 kg 95.3 kg    Examination:  Physical Exam: GEN: NAD, alert and oriented x 3, obese HEENT: NCAT, PERRL, EOMI, sclera clear, MMM PULM: CTAB w/o wheezes/crackles, normal respiratory effort, room air CV: RRR w/o M/G/R GI: abd soft, NTND, NABS, no R/G/M MSK: no peripheral edema, muscle strength globally intact 5/5 bilateral upper/lower extremities NEURO: CN II-XII intact, no focal deficits, sensation to light touch intact PSYCH: normal mood/affect Integumentary: dry/intact, no rashes or wounds    Data Reviewed: I have personally reviewed following labs and imaging studies  CBC: Recent Labs  Lab 01/27/23 2106 01/28/23 0451 01/29/23 0513 01/30/23 0711  WBC 14.0* 13.3* 11.4* 10.3  HGB 12.1 11.6* 11.5* 10.7*  HCT 38.1 36.2 36.1 34.6*  MCV 86.0 87.0 84.1 85.2  PLT 313 312 296 281   Basic Metabolic Panel: Recent Labs  Lab 01/27/23 2106 01/29/23 0513 01/30/23 0711  NA 138 138 137  K 4.1 4.2  4.2  CL 104 103 104  CO2 22 23 25   GLUCOSE 112* 131* 167*  BUN 10 12 8   CREATININE 0.85 0.86 0.78  CALCIUM 9.4 9.1 9.0   GFR: Estimated Creatinine Clearance: 75 mL/min (by C-G formula based on SCr of 0.78 mg/dL). Liver Function Tests: No results for input(s): "AST", "ALT", "ALKPHOS", "BILITOT", "PROT", "ALBUMIN" in the last 168 hours. No results for input(s): "LIPASE", "AMYLASE" in the last 168 hours. No results for input(s): "AMMONIA" in the last 168 hours. Coagulation Profile: No results for input(s): "INR", "PROTIME" in the last 168 hours. Cardiac Enzymes: No results for input(s): "CKTOTAL", "CKMB", "CKMBINDEX", "TROPONINI" in the last 168 hours. BNP (last 3 results) No results for input(s): "PROBNP" in the last 8760 hours. HbA1C: Recent Labs    01/28/23 0451  HGBA1C 6.9*   CBG: Recent Labs  Lab 01/29/23 0744 01/29/23 1257 01/29/23 1548 01/29/23 2122 01/30/23 0732  GLUCAP 241* 176* 215* 213* 170*   Lipid Profile: Recent Labs    01/29/23 0513  CHOL 123  HDL 43  LDLCALC 42  TRIG 190*  CHOLHDL 2.9   Thyroid Function Tests: No results for input(s): "TSH", "T4TOTAL", "FREET4", "T3FREE", "THYROIDAB" in the last 72 hours. Anemia Panel: No results for input(s): "VITAMINB12", "FOLATE", "FERRITIN", "  TIBC", "IRON", "RETICCTPCT" in the last 72 hours. Sepsis Labs: No results for input(s): "PROCALCITON", "LATICACIDVEN" in the last 168 hours.  No results found for this or any previous visit (from the past 240 hour(s)).       Radiology Studies: CARDIAC CATHETERIZATION  Result Date: 01/30/2023   Mid LAD lesion is 80% stenosed with 70% stenosed side branch in 2nd Diag.   Balloon angioplasty was performed on the 2nd Diag ostium, using a BALLOON TAKERU 1.5X12.  Post intervention, the side branch was reduced to 55% residual stenosis.   A drug-eluting stent was successfully placed using a SYNERGY XD 2.25X2 -> deployed to 2.4 mm, postdilated proximally to 2.6 mm. Post  intervention, there is a 0% residual stenosis.   --------------------------------------   Otherwise minimal CAD in the remainder of the coronary system.   --------------------------------------   LV end diastolic pressure is mildly elevated.   There is no aortic valve stenosis. POST-CATH DIAGNOSES Single-vessel CAD with extensive LAD calcification and a segmental 70-85% stenosis in the mid LAD beginning at and involving ostium of small caliber 2nd Diag. Successful bifurcation PCI of the LAD and 2nd Diag using a Synergy XD DES 2.25 mm x 24 mm deployed to 2.4 mm and postdilated proximally to 2.6 mm-reducing the lesion segment to 0% PTCA of ostial 2nd Diag sidebranch with 1.0 mm balloon to maintain flow Otherwise large caliber codominant LCx that terminates as a large posterolateral OM branch and large caliber RCA that terminates as a posterior descending artery with a small PL branch. Reduced EF by echo, mildly increased LVEDP. RECOMMENDATIONS In the absence of any other complications or medical issues, we expect the Leslie [name] to be ready for discharge from an interventional cardiology perspective on 01/30/2023. Recommend uninterrupted dual antiplatelet therapy with Aspirin 81mg  daily and Clopidogrel 75mg  daily for a minimum of 12 months (ACS-Class I recommendation). Would convert to Brilinta 60 mg twice daily or Plavix 75 mg daily SAPT after 1 year. For urgent procedures, could potentially hold DAPT temporarily at 6 months. For significant bleeding or bruising, could stop aspirin at 3-6 months if remains on Brilinta. I have held her ARB dose for today since she is borderline hypotensive in the Cath Lab.  Can consider restarting tomorrow depending on blood pressure assessment prior to discharge. Otherwise continue to titrate CAD GDMT as tolerated. Bryan Lemma, MD   ECHOCARDIOGRAM COMPLETE  Result Date: 01/29/2023    ECHOCARDIOGRAM REPORT   Leslie [name] Name:   Leslie [name] Date of Exam: 01/29/2023 Medical Rec #:   [id number]    Height:       63.0 in Accession #:    [id number]   Weight:       212.3 lb Date of Birth:  Jun 01, 1955    BSA:          1.983 m Leslie [name] Age:    67 years     BP:           131/61 mmHg Leslie [name] Gender: F            HR:           93 bpm. Exam Location:  Inpatient Procedure: 2D Echo, Cardiac Doppler, Color Doppler and Intracardiac            Opacification Agent Indications:    Chest Pain R07.9  History:        Leslie [name] has no prior history of Echocardiogram examinations.  Signs/Symptoms:Chest Pain; Risk Factors:Diabetes, Sleep Apnea,                 Hypertension and Former Smoker.  Sonographer:    Dondra Prader RVT RCS Referring Phys: [id number] Ellsworth Lennox  Sonographer Comments: Technically difficult study due to poor echo windows, suboptimal parasternal window, suboptimal apical window, suboptimal subcostal window and Leslie [name] is obese. Image acquisition challenging due to Leslie [name] body habitus. IMPRESSIONS  1. Significant LV dyssynchrony noted due to LBBB. Left ventricular ejection fraction, by estimation, is 35 to 40%. The left ventricle has moderately decreased function. The left ventricle demonstrates global hypokinesis. Indeterminate diastolic filling due to E-A fusion.  2. Right ventricular systolic function is normal. The right ventricular size is normal. Tricuspid regurgitation signal is inadequate for assessing PA pressure.  3. The mitral valve is grossly normal. Trivial mitral valve regurgitation. No evidence of mitral stenosis.  4. The aortic valve is tricuspid. There is mild calcification of the aortic valve. There is mild thickening of the aortic valve. Aortic valve regurgitation is not visualized. Aortic valve sclerosis is present, with no evidence of aortic valve stenosis.  5. The inferior vena cava is normal in size with greater than 50% respiratory variability, suggesting right atrial pressure of 3 mmHg. FINDINGS  Left Ventricle: Significant LV dyssynchrony noted due to LBBB.  Left ventricular ejection fraction, by estimation, is 35 to 40%. The left ventricle has moderately decreased function. The left ventricle demonstrates global hypokinesis. Definity contrast agent was given IV to delineate the left ventricular endocardial borders. The left ventricular internal cavity size was normal in size. There is no left ventricular hypertrophy. Abnormal (paradoxical) septal motion, consistent with left bundle branch block. Indeterminate diastolic filling due to E-A fusion. Right Ventricle: The right ventricular size is normal. No increase in right ventricular wall thickness. Right ventricular systolic function is normal. Tricuspid regurgitation signal is inadequate for assessing PA pressure. Left Atrium: Left atrial size was normal in size. Right Atrium: Right atrial size was normal in size. Pericardium: There is no evidence of pericardial effusion. Presence of epicardial fat layer. Mitral Valve: The mitral valve is grossly normal. Trivial mitral valve regurgitation. No evidence of mitral valve stenosis. Tricuspid Valve: The tricuspid valve is grossly normal. Tricuspid valve regurgitation is trivial. No evidence of tricuspid stenosis. Aortic Valve: The aortic valve is tricuspid. There is mild calcification of the aortic valve. There is mild thickening of the aortic valve. Aortic valve regurgitation is not visualized. Aortic valve sclerosis is present, with no evidence of aortic valve stenosis. Aortic valve mean gradient measures 4.7 mmHg. Aortic valve peak gradient measures 8.1 mmHg. Aortic valve area, by VTI measures 1.92 cm. Pulmonic Valve: The pulmonic valve was grossly normal. Pulmonic valve regurgitation is not visualized. No evidence of pulmonic stenosis. Aorta: The aortic root and ascending aorta are structurally normal, with no evidence of dilitation. Venous: The inferior vena cava is normal in size with greater than 50% respiratory variability, suggesting right atrial pressure of 3  mmHg. IAS/Shunts: The atrial septum is grossly normal.  LEFT VENTRICLE PLAX 2D LVIDd:         5.10 cm   Diastology LVIDs:         3.70 cm   LV e' medial:    3.81 cm/s LV PW:         1.10 cm   LV E/e' medial:  21.6 LV IVS:        1.00 cm   LV e' lateral:   8.35  cm/s LVOT diam:     1.90 cm   LV E/e' lateral: 9.9 LV SV:         54 LV SV Index:   27 LVOT Area:     2.84 cm  RIGHT VENTRICLE             IVC RV S prime:     13.80 cm/s  IVC diam: 2.00 cm TAPSE (M-mode): 2.0 cm LEFT ATRIUM             Index        RIGHT ATRIUM           Index LA diam:        2.90 cm 1.46 cm/m   RA Area:     12.20 cm LA Vol (A2C):   29.3 ml 14.75 ml/m  RA Volume:   30.40 ml  15.33 ml/m LA Vol (A4C):   33.2 ml 16.74 ml/m LA Biplane Vol: 38.3 ml 19.31 ml/m  AORTIC VALVE                    PULMONIC VALVE AV Area (Vmax):    1.99 cm     PV Vmax:       1.00 m/s AV Area (Vmean):   1.96 cm     PV Peak grad:  4.0 mmHg AV Area (VTI):     1.92 cm AV Vmax:           142.67 cm/s AV Vmean:          97.200 cm/s AV VTI:            0.279 m AV Peak Grad:      8.1 mmHg AV Mean Grad:      4.7 mmHg LVOT Vmax:         100.20 cm/s LVOT Vmean:        67.267 cm/s LVOT VTI:          0.189 m LVOT/AV VTI ratio: 0.68  AORTA Ao Root diam: 2.95 cm Ao Asc diam:  2.90 cm MITRAL VALVE MV Area (PHT): 5.11 cm    SHUNTS MV Decel Time: 149 msec    Systemic VTI:  0.19 m MV E velocity: 82.30 cm/s  Systemic Diam: 1.90 cm MV A velocity: 92.50 cm/s MV E/A ratio:  0.89 Lennie Odor MD Electronically signed by Lennie Odor MD Signature Date/Time: 01/29/2023/4:08:05 PM    Final         Scheduled Meds:  aspirin EC  81 mg Oral Daily   atorvastatin  40 mg Oral Daily   dapagliflozin propanediol  10 mg Oral Daily   fluticasone furoate-vilanterol  1 puff Inhalation Daily   influenza vaccine adjuvanted  0.5 mL Intramuscular Tomorrow-1000   insulin aspart  0-15 Units Subcutaneous TID WC   insulin aspart  0-5 Units Subcutaneous QHS   [START ON 01/31/2023] irbesartan  150  mg Oral Daily   loratadine  10 mg Oral Daily   metoprolol succinate  100 mg Oral Daily   nitroGLYCERIN       pantoprazole  40 mg Oral Daily   spironolactone  12.5 mg Oral Daily   ticagrelor  90 mg Oral BID   Continuous Infusions:     LOS: 2 days    Time spent: 51 minutes spent on chart review, discussion with nursing staff, consultants, updating family and interview/physical exam; more than 50% of that time was spent in counseling and/or coordination of care.    Alvira Philips [place],  DO [facility] Available via Epic secure chat 7am-7pm After these hours, please refer to coverage provider listed on [email or web address] 01/30/2023, 11:52 AM

## 2023-01-30 NOTE — Progress Notes (Signed)
Heart Failure Nurse Navigator Progress Note  PCP: Ignatius Specking, MD PCP-Cardiologist: None Admission Diagnosis: Chest pain Admitted from: Home  Presentation:   Emilio Math presented with chest pain that radiates to her back, Bilateral arm numbness, nauseous,edema to her lower extremities, Troponin 46, EKG without ST segment or T wave changes LBBB block, Left heart cath on 10/28 showed 80 % stenosis in mid- LAD, treated with DES and 70 % stenosis in 2 nd diagonal treated with balloon angioplasty. Plans are to treat with DAPT for NSTEMI and GDMT for heart failure.   ECHO/ LVEF: 35-40%%   Clinical Course:  Past Medical History:  Diagnosis Date   Arthritis    Asthma    Cancer (HCC)    skin cancer   Diabetes mellitus without complication (HCC)    Type 2 NIDDM x 3 years   GERD (gastroesophageal reflux disease)    Hernia, ventral    History of kidney stones    Hyperlipidemia    Hypertension    on meds for 2 years   PONV (postoperative nausea and vomiting)      Social History   Socioeconomic History   Marital status: Married    Spouse name: Not on file   Number of children: Not on file   Years of education: Not on file   Highest education level: Not on file  Occupational History   Not on file  Tobacco Use   Smoking status: Former    Current packs/day: 0.00    Average packs/day: 1 pack/day for 4.0 years (4.0 ttl pk-yrs)    Types: Cigarettes    Start date: 11/12/1963    Quit date: 11/12/1967    Years since quitting: 55.2   Smokeless tobacco: Never   Tobacco comments:    quit smoking 36 years ago  Substance and Sexual Activity   Alcohol use: Yes    Comment: rare; 3 x year   Drug use: No   Sexual activity: Yes    Birth control/protection: Surgical  Other Topics Concern   Not on file  Social History Narrative   Not on file   Social Determinants of Health   Financial Resource Strain: Not on file  Food Insecurity: No Food Insecurity (01/28/2023)   Hunger Vital Sign     Worried About Running Out of Food in the Last Year: Never true    Ran Out of Food in the Last Year: Never true  Transportation Needs: No Transportation Needs (01/28/2023)   PRAPARE - Administrator, Civil Service (Medical): No    Lack of Transportation (Non-Medical): No  Physical Activity: Not on file  Stress: Not on file  Social Connections: Not on file   Education Assessment and Provision:  Detailed education and instructions provided on heart failure disease management including the following:  Signs and symptoms of Heart Failure When to call the physician Importance of daily weights Low sodium diet Fluid restriction Medication management Anticipated future follow-up appointments  Patient education given on each of the above topics.  Patient acknowledges understanding via teach back method and acceptance of all instructions.  Education Materials:  "Living Better With Heart Failure" Booklet, HF zone tool, & Daily Weight Tracker Tool.  Patient has scale at home: Yes Patient has pill box at home: Yes    High Risk Criteria for Readmission and/or Poor Patient Outcomes: Heart failure hospital admissions (last 6 months): 0  No Show rate: 0 Difficult social situation: No Demonstrates medication adherence: Yes Primary Language: [place]  Literacy level: Reading, writing, and comprehension  Barriers of Care:   Diet/ fluid restrictions Daily weights  Considerations/Referrals:   Referral made to Heart Failure Pharmacist Stewardship: Yes Referral made to Heart Failure CSW/NCM TOC: No Referral made to Heart & Vascular TOC clinic: Yes, 02/07/2023 @ 3 pm  Items for Follow-up on DC/TOC: Continued HF education Diet/ fluid restrictions/ daily weights   Rhae Hammock, BSN, RN Heart Failure Teacher, adult education Only

## 2023-01-30 NOTE — Progress Notes (Signed)
PHARMACY - ANTICOAGULATION CONSULT NOTE  Pharmacy Consult for heparin Indication: chest pain/ACS  Allergies  Allergen Reactions   Hydrochlorothiazide Other (See Comments)    Caused High sugars   Morphine And Codeine Nausea And Vomiting   Tape Other (See Comments)    And bandaids also, causes skin rawness and tears   Lisinopril Cough    Patient Measurements: Height: 5\' 3"  (160 cm) Weight: 96.3 kg (212 lb 4.9 oz) IBW/kg (Calculated) : 52.4 Heparin Dosing Weight: 74.4 kg  Vital Signs: Temp: 98 F (36.7 C) (10/27 1933) Temp Source: Oral (10/27 1933) BP: 134/66 (10/27 1933) Pulse Rate: 94 (10/27 1933)  Labs: Recent Labs    01/27/23 2106 01/27/23 2300 01/28/23 0218 01/28/23 0451 01/28/23 1004 01/29/23 0513 01/29/23 1732 01/30/23 0206  HGB 12.1  --   --  11.6*  --  11.5*  --   --   HCT 38.1  --   --  36.2  --  36.1  --   --   PLT 313  --   --  312  --  296  --   --   HEPARINUNFRC  --   --   --   --    < > 0.31 0.20* 0.34  CREATININE 0.85  --   --   --   --  0.86  --   --   TROPONINIHS 9 46* 109* 140*  --   --   --   --    < > = values in this interval not displayed.    Estimated Creatinine Clearance: 70.1 mL/min (by C-G formula based on SCr of 0.86 mg/dL).  Assessment: 31 yoF presented to the ED with chest pain. Pharmacy consulted to dose heparin for ACS.  CBC stable, trops 46, not oral anticoagulation prior to admit. Plans for cath lab on 10/28  10/28 AM: heparin level returned to at goal on 1100 units/hr. Per RN, no issues with the heparin running continuously or signs/symptoms of bleeding. Hgb 11s, plts 296  Goal of Therapy:  Heparin level 0.3-0.7 units/ml Monitor platelets by anticoagulation protocol: Yes   Plan:  Continue heparin at 1100 units/hr Recheck confirmatory heparin level in 6 hours  Daily CBC and heparin level Cath lab on 10/28  Arabella Merles, PharmD. Clinical Pharmacist 01/30/2023 3:01 AM

## 2023-01-30 NOTE — TOC Progression Note (Signed)
Transition of Care [facility]) - Progression Note    Patient Details  Name: Leslie [name] MRN: [id number] Date of Birth: 06-18-1955  Transition of Care [facility]) CM/SW Contact  Ronny Bacon, RN Phone Number: 01/30/2023, 12:09 PM  Clinical Narrative:  Patient given application for patient assistance with cost of Brilinta. Patient reports that her daughter will transport her home when discharged.       Barriers to Discharge: Continued Medical Work up  Expected Discharge Plan and Services         Expected Discharge Date: 01/30/23                                     Social Determinants of Health (SDOH) Interventions SDOH Screenings   Food Insecurity: No Food Insecurity (01/28/2023)  Housing: Low Risk  (01/30/2023)  Transportation Needs: No Transportation Needs (01/30/2023)  Utilities: Not At Risk (01/28/2023)  Alcohol Screen: Low Risk  (01/30/2023)  Financial Resource Strain: Low Risk  (01/30/2023)  Tobacco Use: Medium Risk (01/27/2023)    Readmission Risk Interventions     No data to display

## 2023-01-31 ENCOUNTER — Telehealth: Payer: Self-pay | Admitting: Cardiology

## 2023-01-31 ENCOUNTER — Encounter (HOSPITAL_COMMUNITY): Payer: Self-pay | Admitting: Cardiology

## 2023-01-31 ENCOUNTER — Telehealth (HOSPITAL_COMMUNITY): Payer: Self-pay

## 2023-01-31 ENCOUNTER — Other Ambulatory Visit (HOSPITAL_COMMUNITY): Payer: Self-pay

## 2023-01-31 ENCOUNTER — Other Ambulatory Visit: Payer: Self-pay | Admitting: Cardiology

## 2023-01-31 DIAGNOSIS — I214 Non-ST elevation (NSTEMI) myocardial infarction: Secondary | ICD-10-CM | POA: Diagnosis not present

## 2023-01-31 DIAGNOSIS — Z955 Presence of coronary angioplasty implant and graft: Secondary | ICD-10-CM | POA: Diagnosis not present

## 2023-01-31 DIAGNOSIS — E785 Hyperlipidemia, unspecified: Secondary | ICD-10-CM | POA: Diagnosis not present

## 2023-01-31 DIAGNOSIS — I519 Heart disease, unspecified: Secondary | ICD-10-CM | POA: Diagnosis not present

## 2023-01-31 LAB — GLUCOSE, CAPILLARY: Glucose-Capillary: 207 mg/dL — ABNORMAL HIGH (ref 70–99)

## 2023-01-31 LAB — CBC
HCT: 35.3 % — ABNORMAL LOW (ref 36.0–46.0)
Hemoglobin: 11.1 g/dL — ABNORMAL LOW (ref 12.0–15.0)
MCH: 26.7 pg (ref 26.0–34.0)
MCHC: 31.4 g/dL (ref 30.0–36.0)
MCV: 85.1 fL (ref 80.0–100.0)
Platelets: 284 10*3/uL (ref 150–400)
RBC: 4.15 MIL/uL (ref 3.87–5.11)
RDW: 16.1 % — ABNORMAL HIGH (ref 11.5–15.5)
WBC: 10.2 10*3/uL (ref 4.0–10.5)
nRBC: 0 % (ref 0.0–0.2)

## 2023-01-31 LAB — BASIC METABOLIC PANEL
Anion gap: 11 (ref 5–15)
BUN: 11 mg/dL (ref 8–23)
CO2: 26 mmol/L (ref 22–32)
Calcium: 9.5 mg/dL (ref 8.9–10.3)
Chloride: 102 mmol/L (ref 98–111)
Creatinine, Ser: 0.89 mg/dL (ref 0.44–1.00)
GFR, Estimated: >60 mL/min (ref >60)
Glucose, Bld: 154 mg/dL — ABNORMAL HIGH (ref 70–99)
Potassium: 3.9 mmol/L (ref 3.5–5.1)
Sodium: 139 mmol/L (ref 135–145)

## 2023-01-31 MED ORDER — LABETALOL HCL 5 MG/ML IV SOLN
10.0000 mg | INTRAVENOUS | Status: AC | PRN
Start: 2023-01-31 — End: 2023-01-31

## 2023-01-31 MED ORDER — SODIUM CHLORIDE 0.9 % IV SOLN: 250.0000 mL | INTRAVENOUS | Status: DC | PRN

## 2023-01-31 MED ORDER — METOPROLOL SUCCINATE ER 100 MG PO TB24
100.0000 mg | ORAL_TABLET | Freq: Every day | ORAL | 0 refills | Status: DC
  Filled 2023-01-31: qty 90, 90d supply, fill #0

## 2023-01-31 MED ORDER — SPIRONOLACTONE 25 MG PO TABS
12.5000 mg | ORAL_TABLET | Freq: Every day | ORAL | 0 refills | Status: DC
  Filled 2023-01-31: qty 45, 90d supply, fill #0

## 2023-01-31 MED ORDER — DAPAGLIFLOZIN PROPANEDIOL 10 MG PO TABS
10.0000 mg | ORAL_TABLET | Freq: Every day | ORAL | 0 refills | Status: DC
  Filled 2023-01-31: qty 30, 30d supply, fill #0

## 2023-01-31 MED ORDER — NITROGLYCERIN 0.4 MG SL SUBL: 0.4000 mg | SUBLINGUAL_TABLET | SUBLINGUAL | 2 refills | Status: DC | PRN

## 2023-01-31 MED ORDER — SODIUM CHLORIDE 0.9% FLUSH: 3.0000 mL | INTRAVENOUS | Status: DC | PRN

## 2023-01-31 MED ORDER — IRBESARTAN 150 MG PO TABS
150.0000 mg | ORAL_TABLET | Freq: Every day | ORAL | 0 refills | Status: DC
  Filled 2023-01-31: qty 90, 90d supply, fill #0

## 2023-01-31 MED ORDER — ATORVASTATIN CALCIUM 40 MG PO TABS
40.0000 mg | ORAL_TABLET | Freq: Every day | ORAL | 0 refills | Status: DC
  Filled 2023-01-31: qty 90, 90d supply, fill #0

## 2023-01-31 MED ORDER — SODIUM CHLORIDE 0.9% FLUSH
3.0000 mL | Freq: Two times a day (BID) | INTRAVENOUS | Status: DC
  Administered 2023-01-31: 3 mL via INTRAVENOUS

## 2023-01-31 MED ORDER — HYDRALAZINE HCL 20 MG/ML IJ SOLN
10.0000 mg | INTRAMUSCULAR | Status: AC | PRN
Start: 2023-01-31 — End: 2023-01-31

## 2023-01-31 MED ORDER — SODIUM CHLORIDE 0.9 % IV SOLN
INTRAVENOUS | Status: AC
Start: 2023-01-31 — End: 2023-01-31

## 2023-01-31 MED FILL — Nitroglycerin IV Soln 100 MCG/ML in D5W: INTRA_ARTERIAL | Qty: 10 | Status: AC

## 2023-01-31 NOTE — Discharge Summary (Signed)
Physician Discharge Summary  Saadia Shayne BMW:[id number] DOB: 06-Oct-1955 DOA: 01/27/2023  PCP: Ignatius Specking, MD  Admit date: 01/27/2023 Discharge date: 01/31/2023  Admitted From: Home Disposition: Home  Recommendations for Outpatient Follow-up:  Follow up with PCP in 1-2 weeks Follow-up with cardiology, Azalee Course, PA on 02/14/2023 Discontinue candesartan in favor of irbesartan 150 mg p.o. daily; consider transitioning to [facility] outpatient Metoprolol succinate increased to 100 mg p.o. daily Started on spironolactone 12.5 mg p.o. daily  Discontinued home simvastatin in favor of atorvastatin 40 mg p.o. daily Started on Brilinta following PCI/stent placement to LAD Repeat TTE 3 months to reassess LV function after revascularization Repeat LFTs, lipid panel in 6-8 weeks  Home Health: No Equipment/Devices: None  Discharge Condition: Stable CODE STATUS: Full code Diet recommendation: Healthy/consistent carbohydrate diet  History of present illness:  Rosabel Wallenberg is a 67 y.o. female with past medical history significant for asthma, type 2 diabetes mellitus, HTN, HLD, obesity who presented to [facility] ED on 10/25 with chest pain. Reported intermittent precordial chest pain, after walking from her car to her home, pressure in nature and radiated to her shoulders, and associated with heaviness in her arms. Patient has been of aspirin for several weeks and has noted persistent edema at her lower extremities for the last 2 days.    In the ED, temperature 98.5 F, HR 96, RR 22, BP 155/67, SpO2 100% on room air.  WBC 14.0, hemoglobin 12.1, platelet count 313.  Sodium 138, potassium 4.1, chloride 104, CO2 22, glucose 112, BUN 10, creatinine 0.85.  High sensitive troponin 9 followed by 46.  Chest x-ray with minimal lateral atelectasis versus scarring in the left midlung.  EKG 94 bpm, normal axis, left bundle branch block, sinus rhythm with left atrial enlargement, poor R R wave  progression with no significant ST segment or T wave changes. (Left bundle new from 2017).  Patient was placed on heparin drip for anticoagulation.  Cardiology was consulted; who recommended transfer to [facility] for further cardiac evaluation with anticipated need of left heart catheterization.  Hospital course:  CAD s/p PCI/DES to LAD NSTEMI Patient presenting to ED with chest pain.  Noted elevated troponin.  Cardiology was consulted and patient was transferred to [facility] for further cardiac evaluation.  Patient went left heart catheterization on 10/28 with findings of 80% stenosis mid LAD and 70% stenosis of sidebranch of second diagonal s/p PCI/DES LAD and balloon angioplasty to second diagonal.  Continue metoprolol succinate 100 mg p.o. daily, Brilinta 90 mg p.o. twice daily, atorvastatin 40 mg p.o. daily.  Outpatient follow-up with cardiology scheduled on 02/14/2023.   Essential Hypertension Cardiomyopathy, newly diagnosed TTE with LVEF 35-40% with global hypokinesis.  Underwent left heart catheterization 10/28 with revascularization/stenting LAD and balloon angioplasty to second diagonal. Home amlodipine discontinued. Metoprolol succinate increased to 100 mg p.o. daily, started on Irbesartan 150 mg p.o. daily, spironolactone 12.5 mg p.o. daily.  Farxiga 10 mg p.o. daily. Repeat TTE 3 months to reassess LV function after revascularization   Type 2 diabetes mellitus Home regimen includes glipizide 10 mg p.o. every morning, 5 mg every afternoon, metformin 1000 mg every morning, 5 mg every afternoon, Januvia.  Hemoglobin A1c 6.9 on 01/28/2023, well-controlled.   Dyslipidemia Lipid panel with total cholesterol 123, HDL 43, LDL 42, triglycerides 190. Home simvastatin discontinued in favor of atorvastatin 40 mg p.o. daily. Repeat lipid panel/LFTs 6/8 weeks   Asthma Breo Ellipta 1 puff daily   GERD Omeprazole 20 mg  p.o. daily   Obesity Body mass index is 37.2 kg/m.  Complicates all  facets of care  Discharge Diagnoses:  Principal Problem:   NSTEMI (non-ST elevated myocardial infarction) [facility] ) Active Problems:   Essential hypertension   Diabetes mellitus type 2, controlled (HCC)   GERD (gastroesophageal reflux disease)   Obesity (BMI 30-39.9)   Chest pain   Status post coronary artery stent placement   LV dysfunction   Hyperlipidemia with target low density lipoprotein (LDL) cholesterol less than 55 mg/dL    Discharge Instructions  Discharge Instructions     Amb Referral to Cardiac Rehabilitation   Complete by: As directed    Diagnosis:  Coronary Stents NSTEMI     After initial evaluation and assessments completed: Virtual Based Care may be provided alone or in conjunction with Phase 2 Cardiac Rehab based on patient barriers.: Yes   Intensive Cardiac Rehabilitation (ICR) [facility] location only OR Traditional Cardiac Rehabilitation (TCR) *If criteria for ICR are not met will enroll in TCR [facility] only): Yes   Ambulatory referral to Cardiology   Complete by: As directed    Call MD for:  difficulty breathing, headache or visual disturbances   Complete by: As directed    Call MD for:  extreme fatigue   Complete by: As directed    Call MD for:  persistant dizziness or light-headedness   Complete by: As directed    Call MD for:  persistant nausea and vomiting   Complete by: As directed    Call MD for:  severe uncontrolled pain   Complete by: As directed    Call MD for:  temperature >100.4   Complete by: As directed    Diet - low sodium heart healthy   Complete by: As directed    Increase activity slowly   Complete by: As directed    No wound care   Complete by: As directed       Allergies as of 01/31/2023       Reactions   Hydrochlorothiazide Other (See Comments)   Caused High sugars   Morphine And Codeine Nausea And Vomiting   Tape Other (See Comments)   And bandaids also, causes skin rawness and tears   Lisinopril Cough        Medication List      STOP taking these medications    amLODipine 5 MG tablet Commonly known as: NORVASC   candesartan 16 MG tablet Commonly known as: ATACAND Replaced by: irbesartan 150 MG tablet   simvastatin 40 MG tablet Commonly known as: ZOCOR       TAKE these medications    albuterol 108 (90 Base) MCG/ACT inhaler Commonly known as: VENTOLIN HFA SMARTSIG:1 Puff(s) By Mouth 4 Times Daily PRN   aspirin EC 81 MG tablet Take 81 mg by mouth daily.   atorvastatin 40 MG tablet Commonly known as: LIPITOR Take 1 tablet (40 mg total) by mouth daily.   cetirizine 10 MG tablet Commonly known as: ZYRTEC Take 10 mg by mouth daily.   dapagliflozin propanediol 10 MG Tabs tablet Commonly known as: FARXIGA Take 1 tablet (10 mg total) by mouth daily.   ferrous gluconate 324 MG tablet Commonly known as: FERGON Take 324 mg by mouth 2 (two) times daily with a meal.   fluticasone furoate-vilanterol 100-25 MCG/ACT Aepb Commonly known as: Breo Ellipta Inhale 1 puff into the lungs daily. One puff once daily for 2 weeks   glipiZIDE 10 MG tablet Commonly known as: GLUCOTROL Take 10 mg by  mouth in the morning and at bedtime. Take 10mg  in AM and 5mg  in PM   irbesartan 150 MG tablet Commonly known as: AVAPRO Take 1 tablet (150 mg total) by mouth daily. Replaces: candesartan 16 MG tablet   Januvia 100 MG tablet Generic drug: sitaGLIPtin Take 100 mg by mouth daily.   metFORMIN 500 MG tablet Commonly known as: GLUCOPHAGE Take 500 mg by mouth 2 (two) times daily with a meal. Take 2 tablets in AM and 1 tablet in PM   metoprolol succinate 100 MG 24 hr tablet Commonly known as: TOPROL-XL Take 1 tablet (100 mg total) by mouth daily. Take with or immediately following a meal. What changed:  medication strength how much to take additional instructions Another medication with the same name was removed. Continue taking this medication, and follow the directions you see here.   omeprazole 20 MG  capsule Commonly known as: PRILOSEC Take 20 mg by mouth in the morning and at bedtime.   spironolactone 25 MG tablet Commonly known as: ALDACTONE Take 0.5 tablets (12.5 mg total) by mouth daily.   ticagrelor 90 MG Tabs tablet Commonly known as: BRILINTA Take 1 tablet (90 mg total) by mouth 2 (two) times daily.        Follow-up Information     [facility]. Go in 8 day(s).   Specialty: Cardiology Why: Hospital follow upo 02/07/2023 @ 3 pm PLEASE bring a current medicationlist to appointment FREE valet parking, Entrance C, off [employer] information: [address] [place] [address] [phone]        Azalee Course, [place] Follow up on 02/14/2023.   Specialties: Cardiology, Radiology Why: at 10:55am for your follow up appt with cardiology Contact information: [address] [place] [address] [phone]                Allergies  Allergen Reactions   Hydrochlorothiazide Other (See Comments)    Caused High sugars   Morphine And Codeine Nausea And Vomiting   Tape Other (See Comments)    And bandaids also, causes skin rawness and tears   Lisinopril Cough    Consultations: Cardiology   Procedures/Studies: CARDIAC CATHETERIZATION  Result Date: 01/30/2023   Mid LAD lesion is 80% stenosed with 70% stenosed side branch in 2nd Diag.   Balloon angioplasty was performed on the 2nd Diag ostium, using a BALLOON TAKERU 1.5X12.  Post intervention, the side branch was reduced to 55% residual stenosis.   A drug-eluting stent was successfully placed using a SYNERGY XD 2.25X2 -> deployed to 2.4 mm, postdilated proximally to 2.6 mm. Post intervention, there is a 0% residual stenosis.   --------------------------------------   Otherwise minimal CAD in the remainder of the coronary system.   --------------------------------------   LV end diastolic pressure is mildly elevated.   There is no  aortic valve stenosis. POST-CATH DIAGNOSES Single-vessel CAD with extensive LAD calcification and a segmental 70-85% stenosis in the mid LAD beginning at and involving ostium of small caliber 2nd Diag. Successful bifurcation PCI of the LAD and 2nd Diag using a Synergy XD DES 2.25 mm x 24 mm deployed to 2.4 mm and postdilated proximally to 2.6 mm-reducing the lesion segment to 0% PTCA of ostial 2nd Diag sidebranch with 1.0 mm balloon to maintain flow Otherwise large caliber codominant LCx that terminates as a large posterolateral OM branch and large caliber RCA that terminates as a posterior descending artery with a small PL branch.  Reduced EF by echo, mildly increased LVEDP. RECOMMENDATIONS In the absence of any other complications or medical issues, we expect the patient to be ready for discharge from an interventional cardiology perspective on 01/30/2023. Recommend uninterrupted dual antiplatelet therapy with Aspirin 81mg  daily and Clopidogrel 75mg  daily for a minimum of 12 months (ACS-Class I recommendation). Would convert to Brilinta 60 mg twice daily or Plavix 75 mg daily SAPT after 1 year. For urgent procedures, could potentially hold DAPT temporarily at 6 months. For significant bleeding or bruising, could stop aspirin at 3-6 months if remains on Brilinta. I have held her ARB dose for today since she is borderline hypotensive in the Cath Lab.  Can consider restarting tomorrow depending on blood pressure assessment prior to discharge. Otherwise continue to titrate CAD GDMT as tolerated. Bryan Lemma, MD   ECHOCARDIOGRAM COMPLETE  Result Date: 01/29/2023    ECHOCARDIOGRAM REPORT   Patient Name:   MALISSIE SCHOLZ Date of Exam: 01/29/2023 Medical Rec #:  [id number]    Height:       63.0 in Accession #:    [id number]   Weight:       212.3 lb Date of Birth:  January 20, 1956    BSA:          1.983 m Patient Age:    67 years     BP:           131/61 mmHg Patient Gender: F            HR:           93 bpm. Exam Location:   Inpatient Procedure: 2D Echo, Cardiac Doppler, Color Doppler and Intracardiac            Opacification Agent Indications:    Chest Pain R07.9  History:        Patient has no prior history of Echocardiogram examinations.                 Signs/Symptoms:Chest Pain; Risk Factors:Diabetes, Sleep Apnea,                 Hypertension and Former Smoker.  Sonographer:    Dondra Prader RVT RCS Referring Phys: [id number] Ellsworth Lennox  Sonographer Comments: Technically difficult study due to poor echo windows, suboptimal parasternal window, suboptimal apical window, suboptimal subcostal window and patient is obese. Image acquisition challenging due to patient body habitus. IMPRESSIONS  1. Significant LV dyssynchrony noted due to LBBB. Left ventricular ejection fraction, by estimation, is 35 to 40%. The left ventricle has moderately decreased function. The left ventricle demonstrates global hypokinesis. Indeterminate diastolic filling due to E-A fusion.  2. Right ventricular systolic function is normal. The right ventricular size is normal. Tricuspid regurgitation signal is inadequate for assessing PA pressure.  3. The mitral valve is grossly normal. Trivial mitral valve regurgitation. No evidence of mitral stenosis.  4. The aortic valve is tricuspid. There is mild calcification of the aortic valve. There is mild thickening of the aortic valve. Aortic valve regurgitation is not visualized. Aortic valve sclerosis is present, with no evidence of aortic valve stenosis.  5. The inferior vena cava is normal in size with greater than 50% respiratory variability, suggesting right atrial pressure of 3 mmHg. FINDINGS  Left Ventricle: Significant LV dyssynchrony noted due to LBBB. Left ventricular ejection fraction, by estimation, is 35 to 40%. The left ventricle has moderately decreased function. The left ventricle demonstrates global hypokinesis. Definity contrast agent was given IV to delineate the left ventricular endocardial  borders.  The left ventricular internal cavity size was normal in size. There is no left ventricular hypertrophy. Abnormal (paradoxical) septal motion, consistent with left bundle branch block. Indeterminate diastolic filling due to E-A fusion. Right Ventricle: The right ventricular size is normal. No increase in right ventricular wall thickness. Right ventricular systolic function is normal. Tricuspid regurgitation signal is inadequate for assessing PA pressure. Left Atrium: Left atrial size was normal in size. Right Atrium: Right atrial size was normal in size. Pericardium: There is no evidence of pericardial effusion. Presence of epicardial fat layer. Mitral Valve: The mitral valve is grossly normal. Trivial mitral valve regurgitation. No evidence of mitral valve stenosis. Tricuspid Valve: The tricuspid valve is grossly normal. Tricuspid valve regurgitation is trivial. No evidence of tricuspid stenosis. Aortic Valve: The aortic valve is tricuspid. There is mild calcification of the aortic valve. There is mild thickening of the aortic valve. Aortic valve regurgitation is not visualized. Aortic valve sclerosis is present, with no evidence of aortic valve stenosis. Aortic valve mean gradient measures 4.7 mmHg. Aortic valve peak gradient measures 8.1 mmHg. Aortic valve area, by VTI measures 1.92 cm. Pulmonic Valve: The pulmonic valve was grossly normal. Pulmonic valve regurgitation is not visualized. No evidence of pulmonic stenosis. Aorta: The aortic root and ascending aorta are structurally normal, with no evidence of dilitation. Venous: The inferior vena cava is normal in size with greater than 50% respiratory variability, suggesting right atrial pressure of 3 mmHg. IAS/Shunts: The atrial septum is grossly normal.  LEFT VENTRICLE PLAX 2D LVIDd:         5.10 cm   Diastology LVIDs:         3.70 cm   LV e' medial:    3.81 cm/s LV PW:         1.10 cm   LV E/e' medial:  21.6 LV IVS:        1.00 cm   LV e' lateral:   8.35 cm/s  LVOT diam:     1.90 cm   LV E/e' lateral: 9.9 LV SV:         54 LV SV Index:   27 LVOT Area:     2.84 cm  RIGHT VENTRICLE             IVC RV S prime:     13.80 cm/s  IVC diam: 2.00 cm TAPSE (M-mode): 2.0 cm LEFT ATRIUM             Index        RIGHT ATRIUM           Index LA diam:        2.90 cm 1.46 cm/m   RA Area:     12.20 cm LA Vol (A2C):   29.3 ml 14.75 ml/m  RA Volume:   30.40 ml  15.33 ml/m LA Vol (A4C):   33.2 ml 16.74 ml/m LA Biplane Vol: 38.3 ml 19.31 ml/m  AORTIC VALVE                    PULMONIC VALVE AV Area (Vmax):    1.99 cm     PV Vmax:       1.00 m/s AV Area (Vmean):   1.96 cm     PV Peak grad:  4.0 mmHg AV Area (VTI):     1.92 cm AV Vmax:           142.67 cm/s AV Vmean:  97.200 cm/s AV VTI:            0.279 m AV Peak Grad:      8.1 mmHg AV Mean Grad:      4.7 mmHg LVOT Vmax:         100.20 cm/s LVOT Vmean:        67.267 cm/s LVOT VTI:          0.189 m LVOT/AV VTI ratio: 0.68  AORTA Ao Root diam: 2.95 cm Ao Asc diam:  2.90 cm MITRAL VALVE MV Area (PHT): 5.11 cm    SHUNTS MV Decel Time: 149 msec    Systemic VTI:  0.19 m MV E velocity: 82.30 cm/s  Systemic Diam: 1.90 cm MV A velocity: 92.50 cm/s MV E/A ratio:  0.89 Lennie Odor MD Electronically signed by Lennie Odor MD Signature Date/Time: 01/29/2023/4:08:05 PM    Final    DG Chest 2 View  Result Date: 01/27/2023 CLINICAL DATA:  Chest pain EXAM: CHEST - 2 VIEW COMPARISON:  Chest x-ray 12/23/2021 FINDINGS: The heart size and mediastinal contours are within normal limits. There is minimal linear atelectasis or scarring in the left mid lung. The lungs are otherwise clear. There is no pleural effusion or pneumothorax. Visualized skeletal structures are unremarkable. IMPRESSION: Minimal linear atelectasis or scarring in the left mid lung. Electronically Signed   By: Darliss Cheney M.D.   On: 01/27/2023 22:08     Subjective: Patient seen examined bedside, resting calmly.  Sitting at edge of bed.  Complaining of some mild  shortness of breath after initiation of Brilinta.  Discussed side effect profile and if persist may need to consider changing to a different antiplatelet but will defer to cardiology.  Seen by cardiology with no further recommendations and ready for discharge home with outpatient follow-up scheduled.  Patient further denies headache, no dizziness, no chest pain, no palpitations, no fever/chills/night sweats, no nausea/vomiting/diarrhea, no focal weakness, no fatigue, no cough/congestion, no paresthesias.  No acute events overnight per nursing staff.  Discharge Exam: Vitals:   01/30/23 2013 01/31/23 0304  BP: (!) 145/71 129/70  Pulse: 88 85  Resp: 16 18  Temp: 98 F (36.7 C) 98.1 F (36.7 C)  SpO2: 100% 98%   Vitals:   01/30/23 1145 01/30/23 1152 01/30/23 2013 01/31/23 0304  BP: 126/68 135/63 (!) 145/71 129/70  Pulse:  85 88 85  Resp:  16 16 18   Temp:  98.1 F (36.7 C) 98 F (36.7 C) 98.1 F (36.7 C)  TempSrc:  Oral Oral Oral  SpO2:  99% 100% 98%  Weight:    94.3 kg  Height:        Physical Exam: GEN: NAD, alert and oriented x 3, obese HEENT: NCAT, PERRL, EOMI, sclera clear, MMM PULM: CTAB w/o wheezes/crackles, normal respiratory effort, on room air CV: RRR w/o M/G/R GI: abd soft, NTND, NABS, no R/G/M MSK: no peripheral edema, muscle strength globally intact 5/5 bilateral upper/lower extremities NEURO: CN II-XII intact, no focal deficits, sensation to light touch intact PSYCH: normal mood/affect Integumentary: dry/intact, no rashes or wounds    The results of significant diagnostics from this hospitalization (including imaging, microbiology, ancillary and laboratory) are listed below for reference.     Microbiology: No results found for this or any previous visit (from the past 240 hour(s)).   Labs: BNP (last 3 results) No results for input(s): "BNP" in the last 8760 hours. Basic Metabolic Panel: Recent Labs  Lab 01/27/23 2106 01/29/23 0513 01/30/23 2542  01/31/23 0449  NA 138 138 137 139  K 4.1 4.2 4.2 3.9  CL 104 103 104 102  CO2 22 23 25 26   GLUCOSE 112* 131* 167* 154*  BUN 10 12 8 11   CREATININE 0.85 0.86 0.78 0.89  CALCIUM 9.4 9.1 9.0 9.5   Liver Function Tests: No results for input(s): "AST", "ALT", "ALKPHOS", "BILITOT", "PROT", "ALBUMIN" in the last 168 hours. No results for input(s): "LIPASE", "AMYLASE" in the last 168 hours. No results for input(s): "AMMONIA" in the last 168 hours. CBC: Recent Labs  Lab 01/27/23 2106 01/28/23 0451 01/29/23 0513 01/30/23 0711 01/31/23 0449  WBC 14.0* 13.3* 11.4* 10.3 10.2  HGB 12.1 11.6* 11.5* 10.7* 11.1*  HCT 38.1 36.2 36.1 34.6* 35.3*  MCV 86.0 87.0 84.1 85.2 85.1  PLT 313 312 296 281 284   Cardiac Enzymes: No results for input(s): "CKTOTAL", "CKMB", "CKMBINDEX", "TROPONINI" in the last 168 hours. BNP: Invalid input(s): "POCBNP" CBG: Recent Labs  Lab 01/30/23 0732 01/30/23 1152 01/30/23 1602 01/30/23 2111 01/31/23 0807  GLUCAP 170* 239* 191* 181* 207*   D-Dimer No results for input(s): "DDIMER" in the last 72 hours. Hgb A1c No results for input(s): "HGBA1C" in the last 72 hours. Lipid Profile Recent Labs    01/29/23 0513  CHOL 123  HDL 43  LDLCALC 42  TRIG 190*  CHOLHDL 2.9   Thyroid function studies No results for input(s): "TSH", "T4TOTAL", "T3FREE", "THYROIDAB" in the last 72 hours.  Invalid input(s): "FREET3" Anemia work up No results for input(s): "VITAMINB12", "FOLATE", "FERRITIN", "TIBC", "IRON", "RETICCTPCT" in the last 72 hours. Urinalysis    Component Value Date/Time   COLORURINE YELLOW 11/12/2014 1602   APPEARANCEUR CLEAR 11/12/2014 1602   LABSPEC 1.013 11/12/2014 1602   PHURINE 6.0 11/12/2014 1602   GLUCOSEU NEGATIVE 11/12/2014 1602   HGBUR NEGATIVE 11/12/2014 1602   BILIRUBINUR NEGATIVE 11/12/2014 1602   KETONESUR NEGATIVE 11/12/2014 1602   PROTEINUR NEGATIVE 11/12/2014 1602   UROBILINOGEN 1.0 11/12/2014 1602   NITRITE NEGATIVE  11/12/2014 1602   LEUKOCYTESUR NEGATIVE 11/12/2014 1602   Sepsis Labs Recent Labs  Lab 01/28/23 0451 01/29/23 0513 01/30/23 0711 01/31/23 0449  WBC 13.3* 11.4* 10.3 10.2   Microbiology No results found for this or any previous visit (from the past 240 hour(s)).   Time coordinating discharge: Over 30 minutes  SIGNED:   Alvira Philips [place], DO  [facility] 01/31/2023, 8:57 AM

## 2023-01-31 NOTE — Progress Notes (Signed)
CARDIAC REHAB PHASE I   Pt dressed and ready for discharge home. Ambulating independently with no CP, SOB or dizziness. Post MI/stent education completed. Referral sent to [facility] for CRP2.   [id number]  Woodroe Chen, RN BSN 01/31/2023 9:39 AM

## 2023-01-31 NOTE — Progress Notes (Signed)
Called and spoke with patient's daughter regarding confusion with medications. Daughter reports she has a large supply of candesartan at home (I advised ok to continue this medication in the place of the irbesartan). Reviewed medications in detail. Will send in a Rx for SL NTG as well.

## 2023-01-31 NOTE — Telephone Encounter (Signed)
   Transition of Care Follow-up Phone Call Request    Patient Name: Leslie [name] Date of Birth: 03/17/1956 Date of Encounter: 01/31/2023  Primary Care Provider:  Ignatius Specking, MD Primary Cardiologist:  Nicki Guadalajara, MD  Leslie [name] has been scheduled for a transition of care follow up appointment with a [facility] provider:  [name] 11/12  Please reach out to [facility] within 48 hours of discharge to confirm appointment and review transition of care protocol questionnaire. Anticipated discharge date: 10/29  Leslie Page, NP  01/31/2023, 8:53 AM

## 2023-01-31 NOTE — Telephone Encounter (Signed)
Patient currently still admitted.Appears to be getting discharged today.

## 2023-01-31 NOTE — Telephone Encounter (Signed)
Heart Failure Patient Advocate Encounter  Medication assistance forms for Brilinta, Marcelline Deist have been started. Patient has signed forms.  Provider information will need to be completed before submitting.  Forms have been attached to patient chart under 'Media' tab. Routing to follow up office.  Burnell Blanks, CPhT Rx Patient Advocate Phone: [phone]

## 2023-01-31 NOTE — Progress Notes (Addendum)
   Patient Name: Leslie [name] Date of Encounter: 01/31/2023 [facility] Cardiologist: Nicki Guadalajara, MD   Interval Summary  .    Sitting up on the side of the bed. No complaints. Packing to go home.   Vital Signs .    Vitals:   01/30/23 1145 01/30/23 1152 01/30/23 2013 01/31/23 0304  BP: 126/68 135/63 (!) 145/71 129/70  Pulse:  85 88 85  Resp:  16 16 18   Temp:  98.1 F (36.7 C) 98 F (36.7 C) 98.1 F (36.7 C)  TempSrc:  Oral Oral Oral  SpO2:  99% 100% 98%  Weight:    94.3 kg  Height:       No intake or output data in the 24 hours ending 01/31/23 0853    01/31/2023    3:04 AM 01/30/2023    4:29 AM 01/28/2023    2:53 PM  Last 3 Weights  Weight (lbs) 208 lb 210 lb 212 lb 4.9 oz  Weight (kg) 94.348 kg 95.255 kg 96.3 kg      Telemetry/ECG    Sinus Rhythm -90s - Personally Reviewed  Physical Exam .   GEN: No acute distress.   Neck: No JVD Cardiac: RRR, no murmurs, rubs, or gallops.  Respiratory: Clear to auscultation bilaterally. GI: Soft, nontender, non-distended  MS: No edema Skin: right radial cath site stable  Assessment & Plan .     67 y.o. female with a history of hypertension, hyperlipidemia, type 2 diabetes mellitus, and GERD who presented to the [facility] ED on 01/27/2023 for chest pain that radiated to arms, back, and jaw. EKG showed normal sinus rhythm with new LBBB. High-sensitivity troponin mildly elevated. Therefore, she was transferred to [facility] for further evaluation   NSTEMI -- Presented with chest pain with radiation to arms, back and jaw.  EKG showed new left bundle branch block and high-sensitivity troponin peaked at 140.  Underwent cardiac catheterization 10/28 showing 80% stenosis of mid LAD and 70% stenosis of sidebranch of second diagonal with successful PCI/DES to LAD and balloon angioplasty second diagonal.  Recommendations for DAPT with aspirin/Brilinta for at least 1 year.  Seen by cardiac rehab. -- Continue aspirin,  Brilinta, metoprolol XL 100 mg daily, irbesartan 150 mg daily, atorvastatin 40 mg daily  Acute HFrEF ICM -- Echo with LVEF of 35 to 40%, global hypokinesis, significant LV dyssynchrony due to left bundle branch block -- GDMT: Continue metoprolol XL 100 mg daily, irbesartan 150 mg daily (candesartan PTA), Farxiga 10 mg daily, spironolactone 12.5 mg daily.  Consideration given to starting Entresto but reports she is already in the donut hole and will be on Brilinta as well.  Defer for now  Hypertension -- Well-controlled -- Continue irbesartan 150 mg daily, metoprolol XL 100 mg daily, spironolactone 12.5 mg daily  Hyperlipidemia -- LDL 42, HDL 43, triglycerides 190 -- PTA simvastatin stopped and started on atorvastatin 40 mg daily -- Will need LFT/FLP in 8 weeks  Diabetes -- Hemoglobin A1c 6.9 -- Farxiga 10 mg daily added this admission -- PTA meds include metformin, Januvia and glipizide  Follow up arranged in the office.   For questions or updates, please contact [facility] Please consult [email or web address] for contact info under        Signed, Laverda Page, NP   Agree with assessment and plan.  Tolerating initiation of guideline directed medical therapy.  For DC today   Lennette Bihari, MD, [facility] 01/31/2023 11:48 AM

## 2023-01-31 NOTE — Progress Notes (Signed)
Heart Failure Stewardship Pharmacist Progress Note   PCP: Ignatius Specking, MD PCP-Cardiologist: Nicki Guadalajara, MD    HPI:  [phone] initially presenting to [facility] ED on 10/25 with CP - when walking from her car to her home, radiated to her shoulders, heaviness in her arms. PMH includes asthma, T2DM, HTN, HLD, obesity.  Pt reports that she has been off ASA for several weeks, noted persistent edema in her LE for the past 2 days. Tp 9 > 46. Initial EKG without significant ST segment or T wave changes, LBBB block. Echo 10/28 with EF 35-40%, LV moderately decreased function, global hypokinesis, RV normal. Transferred to [facility] for LHC. Troponin continued to increase. LHC on 10/28 demonstrated 80% stenosis in mid-LAD treated with DES and 70% stenosis in 2nd diagonal treated with balloon angioplasty. Otherwise minimal CAD. LVEDP mildly elevated to 25. Borderline hypotensive during cath - recommending holding ARB x1 day. Plan to treat with DAPT for NSTEMI and GDMT for HF.   Pt and family are concerned about drug costs since pt is in the donut hole. Believe they would be eligible for patient assistance based on income cut-offs. Pt denies missed doses of medications prior to admission. Uses a pill box to organize her medications.   Discharge HF Medications: Beta Blocker: metoprolol succinate 100 mg PO daily ACE/ARB/ARNI: irbesartan 150 mg PO daily MRA: spironolactone 12.5 mg PO daily SGLT2i: dapagliflozin (Farxiga) 10 mg PO daily  Prior to admission HF Medications: Beta blocker: metoprolol succinate 50 mg PO BID - pt unsure why she is taking BID ACE/ARB/ARNI: candesartan 16 mg PO daily Other: amlodipine 5 mg PO daily  Pertinent Lab Values: Serum creatinine 0.89, BUN 11, Potassium 3.9, Sodium 139, A1c 6.9%, Tp 9 > 45 > 109 > 140  Vital Signs: Weight: 208 lbs (admission weight: 215.6 lbs) Blood pressure: 120/70s  Heart rate: 70-80s  I/O: not well documented  Medication Assistance / Insurance Benefits  Check: Does the patient have prescription insurance?  Yes Type of insurance plan: A[employer]na Medicare  Test claims:  Ran test claim for Jardiance 10 mg and the current 30 day co-pay is $150.25 due to being in Coverage Gap (donut hole).   Ran test claim for Farixga  10 mg and the current 30 day co-pay is $143.16 due to being in Coverage Gap (donut hole).  Ran test claim for Brilinta 90 mg and the current 30 day co-pay is $110.92 due to being in Coverage Gap (donut hole).   Does the patient qualify for medication assistance through manufacturers or grants?   Yes Eligible grants and/or patient assistance programs: Cathren Harsh (if added) Medication assistance applications in progress: [facility]  Medication assistance applications approved: n/a Approved medication assistance renewals will be completed by: [facility]  Outpatient Pharmacy:  Prior to admission outpatient pharmacy: [facility] Is the patient willing to use [facility] TOC pharmacy at discharge? Yes Is the patient willing to transition their outpatient pharmacy to utilize a [facility] outpatient pharmacy?   No    Assessment: 1. New onset systolic CHF (LVEF 35-40%), due to ischemic cardiomyopathy, in the setting of NSTEMI. NYHA class II symptoms. = Continue metoprolol XL 100 mg daily, HR improving - Continue irbesartan 150 mg daily - consider transitioning to Entresto at follow up. Pt has cough documented with lisinopril, but no known C/I to sacubitril/valsartan.  - Continue spironolactone 12.5 mg daily and Farxiga 10 mg daily - Agree with discontinuation of amlodipine to allow for further titration of GDMT and  reduce LE edema.  - Pt is likely eligible for patient assistance programs for branded medications   Plan: 1) Medication changes recommended at this time: - Continue metoprolol succinate 100 mg PO daily  - Continue spironolactone 12.5 mg PO daily - Continue dapagliflozin (Farxiga) 10 mg PO daily - Continue  irbesartan 150 mg PO daily. Consider transitioning to Entresto (sacubitril-valsartan) 24-26 mg PO BID at follow up, as we could start patient assistance application.   2) Patient assistance: - Obtained signature for [place] application for Brillinta, Farxiga  3)  Education  - Patient has been educated on current HF medications and potential additions to HF medication regimen - Patient verbalizes understanding that over the next few months, these medication doses may change and more medications may be added to optimize HF regimen - Patient has been educated on basic disease state pathophysiology and goals of therapy   Sharen Hones, PharmD, BCPS Heart Failure Stewardship Pharmacist Phone [phone]

## 2023-02-01 ENCOUNTER — Other Ambulatory Visit (HOSPITAL_COMMUNITY): Payer: Self-pay

## 2023-02-01 LAB — LIPOPROTEIN A (LPA): Lipoprotein (a): 18.3 nmol/L (ref <75.0)

## 2023-02-01 NOTE — Telephone Encounter (Signed)
[facility] P[facility]

## 2023-02-01 NOTE — Telephone Encounter (Signed)
error 

## 2023-02-03 NOTE — Telephone Encounter (Signed)
Forms printed, will be given to Brandie R in order for doctor to sign.

## 2023-02-07 ENCOUNTER — Encounter (HOSPITAL_COMMUNITY): Payer: Self-pay

## 2023-02-07 ENCOUNTER — Other Ambulatory Visit (HOSPITAL_COMMUNITY): Payer: Self-pay

## 2023-02-07 ENCOUNTER — Ambulatory Visit (HOSPITAL_COMMUNITY)
Admit: 2023-02-07 | Discharge: 2023-02-07 | Disposition: A | Discharge status: Home / Self Care | Payer: Medicare HMO | Attending: Cardiology | Admitting: Cardiology

## 2023-02-07 VITALS — BP 162/101 | HR 93 | Wt 206.0 lb

## 2023-02-07 DIAGNOSIS — I5022 Chronic systolic (congestive) heart failure: Secondary | ICD-10-CM | POA: Insufficient documentation

## 2023-02-07 DIAGNOSIS — I5020 Unspecified systolic (congestive) heart failure: Secondary | ICD-10-CM | POA: Diagnosis not present

## 2023-02-07 DIAGNOSIS — I214 Non-ST elevation (NSTEMI) myocardial infarction: Secondary | ICD-10-CM

## 2023-02-07 DIAGNOSIS — I2510 Atherosclerotic heart disease of native coronary artery without angina pectoris: Secondary | ICD-10-CM | POA: Diagnosis not present

## 2023-02-07 DIAGNOSIS — E785 Hyperlipidemia, unspecified: Secondary | ICD-10-CM | POA: Insufficient documentation

## 2023-02-07 DIAGNOSIS — Z79899 Other long term (current) drug therapy: Secondary | ICD-10-CM | POA: Insufficient documentation

## 2023-02-07 DIAGNOSIS — K30 Functional dyspepsia: Secondary | ICD-10-CM | POA: Diagnosis not present

## 2023-02-07 DIAGNOSIS — Z955 Presence of coronary angioplasty implant and graft: Secondary | ICD-10-CM | POA: Insufficient documentation

## 2023-02-07 DIAGNOSIS — I447 Left bundle-branch block, unspecified: Secondary | ICD-10-CM | POA: Diagnosis not present

## 2023-02-07 DIAGNOSIS — I110 Hypertensive heart disease with heart failure: Secondary | ICD-10-CM | POA: Diagnosis not present

## 2023-02-07 DIAGNOSIS — Z7984 Long term (current) use of oral hypoglycemic drugs: Secondary | ICD-10-CM | POA: Diagnosis not present

## 2023-02-07 DIAGNOSIS — Z7902 Long term (current) use of antithrombotics/antiplatelets: Secondary | ICD-10-CM | POA: Insufficient documentation

## 2023-02-07 DIAGNOSIS — E119 Type 2 diabetes mellitus without complications: Secondary | ICD-10-CM | POA: Diagnosis not present

## 2023-02-07 DIAGNOSIS — Z9861 Coronary angioplasty status: Secondary | ICD-10-CM

## 2023-02-07 DIAGNOSIS — Z7982 Long term (current) use of aspirin: Secondary | ICD-10-CM | POA: Insufficient documentation

## 2023-02-07 MED ORDER — SPIRONOLACTONE 25 MG PO TABS: 25.0000 mg | ORAL_TABLET | Freq: Every day | ORAL | 3 refills | Status: DC

## 2023-02-07 NOTE — Patient Instructions (Signed)
INCREASE Spironolactone to 25 mg daily.  Labs done today, your results will be available in MyChart, we will contact you for abnormal readings.  MONITOR BLOOD PRESSURE DAILY AT HOME AND BRING TO YOUR FOLLOW UP APPOINTMENT NEXT WEEK,  If you have any questions, issues, or concerns before your next appointment please call our office at [phone], opt. 2 and leave a message for the triage nurse.

## 2023-02-07 NOTE — Progress Notes (Signed)
HEART & VASCULAR TRANSITION OF CARE CONSULT NOTE     Referring Physician: Dr. Tresa Endo  Primary Care: Ignatius Specking, MD Primary Cardiologist: Nicki Guadalajara, MD  HPI: Referred to clinic by Dr. Tresa Endo, Cardiology, for heart failure consultation.   67 y/o female w/ prior h/o HTN, HLD and type 2DM. Recently presented 10/24 w/ chest pain w/ radiation to neck and back. Hs trops mildly elevated, peaking at 140. EKG showed LBBB, new from prior tracings in 2017. QRS 144 ms. Echo showed moderately reduced LVEF, 35-40% w/ significant LV dyssynchrony, due to LBBB. RV normal. Subsequent LHC demonstrated 80% stenosis of mid LAD and 70% stenosis of a sidebranch of the second diagonal. She underwent successful PCI/DES to LAD and balloon angioplasty of the second diagonal. Placed on DAPT w/ ASA + Brilinta + HF GDMT. Referred to [facility] clinic at discharge.   She presents to clinic today for assessment. Here w/ her daughter. Denies any further similar chest, neck or back pain like she had when she presented to the hospital but has had different "indigestion" like pain, associated w/ and slightly relieved w/ belching. She is on PPI therapy w/ Prilosec. She denies melena/hematochezia. Denies resting dyspnea. She report chronic/stable dyspnea when walking long distances. This was present well before her HF diagnosis. She denies dyspnea w/ basic ADLs. No LEE. Wt stable at home post d/c and down 2 lb from hospital d/c wt. ReDs 20%.   She is scheduled to see her PCP tomorrow.    Of note, her husband was previously followed by Dr. Shirlee Latch, he passed away.   Cardiac Testing   2D Echo 10/24   1. Significant LV dyssynchrony noted due to LBBB. Left ventricular  ejection fraction, by estimation, is 35 to 40%. The left ventricle has  moderately decreased function. The left ventricle demonstrates global  hypokinesis. Indeterminate diastolic filling  due to E-A fusion.   2. Right ventricular systolic function is normal. The  right ventricular  size is normal. Tricuspid regurgitation signal is inadequate for assessing  PA pressure.   3. The mitral valve is grossly normal. Trivial mitral valve  regurgitation. No evidence of mitral stenosis.   4. The aortic valve is tricuspid. There is mild calcification of the  aortic valve. There is mild thickening of the aortic valve. Aortic valve  regurgitation is not visualized. Aortic valve sclerosis is present, with  no evidence of aortic valve stenosis.   5. The inferior vena cava is normal in size with greater than 50%  respiratory variability, suggesting right atrial pressure of 3 mmHg.    LHC 10/24   Mid LAD lesion is 80% stenosed with 70% stenosed side branch in 2nd Diag.   Balloon angioplasty was performed on the 2nd Diag ostium, using a BALLOON TAKERU 1.5X12.  Post intervention, the side branch was reduced to 55% residual stenosis.   A drug-eluting stent was successfully placed using a SYNERGY XD 2.25X2 -> deployed to 2.4 mm, postdilated proximally to 2.6 mm. Post intervention, there is a 0% residual stenosis.   --------------------------------------   Otherwise minimal CAD in the remainder of the coronary system.   --------------------------------------   LV end diastolic pressure is mildly elevated.   There is no aortic valve stenosis.   POST-CATH DIAGNOSES Single-vessel CAD with extensive LAD calcification and a segmental 70-85% stenosis in the mid LAD beginning at and involving ostium of small caliber 2nd Diag. Successful bifurcation PCI of the LAD and 2nd Diag using a Synergy XD DES  2.25 mm x 24 mm deployed to 2.4 mm and postdilated proximally to 2.6 mm-reducing the lesion segment to 0% PTCA of ostial 2nd Diag sidebranch with 1.0 mm balloon to maintain flow Otherwise large caliber codominant LCx that terminates as a large posterolateral OM branch and large caliber RCA that terminates as a posterior descending artery with a small PL branch. Reduced EF by echo,  mildly increased LVEDP.        Review of Systems: [y] = yes, [ ]  = no   General: Weight gain [ ] ; Weight loss [ ] ; Anorexia [ ] ; Fatigue [ ] ; Fever [ ] ; Chills [ ] ; Weakness [ ]   Cardiac: Chest pain/pressure [ ] ; Resting SOB [ ] ; Exertional SOB [ ] ; Orthopnea [ ] ; Pedal Edema [ ] ; Palpitations [ ] ; Syncope [ ] ; Presyncope [ ] ; Paroxysmal nocturnal dyspnea[ ]   Pulmonary: Cough [ ] ; Wheezing[ ] ; Hemoptysis[ ] ; Sputum [ ] ; Snoring [ ]   GI: Vomiting[ ] ; Dysphagia[ ] ; Melena[ ] ; Hematochezia [ ] ; Heartburn[ ] ; Abdominal pain [ ] ; Constipation [ ] ; Diarrhea [ ] ; BRBPR [ ]   GU: Hematuria[ ] ; Dysuria [ ] ; Nocturia[ ]   Vascular: Pain in legs with walking [ ] ; Pain in feet with lying flat [ ] ; Non-healing sores [ ] ; Stroke [ ] ; TIA [ ] ; Slurred speech [ ] ;  Neuro: Headaches[ ] ; Vertigo[ ] ; Seizures[ ] ; Paresthesias[ ] ;Blurred vision [ ] ; Diplopia [ ] ; Vision changes [ ]   Ortho/Skin: Arthritis [ ] ; Joint pain [ ] ; Muscle pain [ ] ; Joint swelling [ ] ; Back Pain [ ] ; Rash [ ]   Psych: Depression[ ] ; Anxiety[ ]   Heme: Bleeding problems [ ] ; Clotting disorders [ ] ; Anemia [ ]   Endocrine: Diabetes [ ] ; Thyroid dysfunction[ ]    Past Medical History:  Diagnosis Date   Arthritis    Asthma    Cancer (HCC)    skin cancer   Diabetes mellitus without complication (HCC)    Type 2 NIDDM x 3 years   GERD (gastroesophageal reflux disease)    Hernia, ventral    History of kidney stones    Hyperlipidemia    Hypertension    on meds for 2 years   PONV (postoperative nausea and vomiting)     Current Outpatient Medications  Medication Sig Dispense Refill   albuterol (VENTOLIN HFA) 108 (90 Base) MCG/ACT inhaler SMARTSIG:1 Puff(s) By Mouth 4 Times Daily PRN     aspirin EC 81 MG tablet Take 81 mg by mouth daily.     atorvastatin (LIPITOR) 40 MG tablet Take 1 tablet (40 mg total) by mouth daily. 90 tablet 0   cetirizine (ZYRTEC) 10 MG tablet Take 10 mg by mouth daily.     dapagliflozin propanediol (FARXIGA) 10  MG TABS tablet Take 1 tablet (10 mg total) by mouth daily. 90 tablet 0   ferrous gluconate (FERGON) 324 MG tablet Take 324 mg by mouth 2 (two) times daily with a meal.     glipiZIDE (GLUCOTROL) 10 MG tablet Take 10 mg by mouth in the morning and at bedtime. Take 10mg  in AM and 5mg  in PM     irbesartan (AVAPRO) 150 MG tablet Take 1 tablet (150 mg total) by mouth daily. 90 tablet 0   metFORMIN (GLUCOPHAGE) 500 MG tablet Take 500 mg by mouth 2 (two) times daily with a meal. Take 2 tablets in AM and 1 tablet in PM     metoprolol succinate (TOPROL-XL) 100 MG 24 hr tablet Take 1 tablet (100 mg total)  by mouth daily. Take with or immediately following a meal. 90 tablet 0   nitroGLYCERIN (NITROSTAT) 0.4 MG SL tablet Place 1 tablet (0.4 mg total) under the tongue every 5 (five) minutes as needed for chest pain. 25 tablet 2   omeprazole (PRILOSEC) 20 MG capsule Take 20 mg by mouth in the morning and at bedtime.     ticagrelor (BRILINTA) 90 MG TABS tablet Take 1 tablet (90 mg total) by mouth 2 (two) times daily. 60 tablet 11   spironolactone (ALDACTONE) 25 MG tablet Take 1 tablet (25 mg total) by mouth daily. 90 tablet 3   No current facility-administered medications for this encounter.    Allergies  Allergen Reactions   Hydrochlorothiazide Other (See Comments)    Caused High sugars   Morphine And Codeine Nausea And Vomiting   Tape Other (See Comments)    And bandaids also, causes skin rawness and tears   Lisinopril Cough      Social History   Socioeconomic History   Marital status: Married    Spouse name: Richard   Number of children: 1   Years of education: Not on file   Highest education level: High school graduate  Occupational History   Occupation: Retired  Tobacco Use   Smoking status: Former    Current packs/day: 0.00    Average packs/day: 1 pack/day for 4.0 years (4.0 ttl pk-yrs)    Types: Cigarettes    Start date: 11/12/1963    Quit date: 11/12/1967    Years since quitting: 55.2    Smokeless tobacco: Never   Tobacco comments:    quit smoking 36 years ago  Vaping Use   Vaping status: Never Used  Substance and Sexual Activity   Alcohol use: Yes    Comment: rare; 3 x year   Drug use: No   Sexual activity: Yes    Birth control/protection: Surgical  Other Topics Concern   Not on file  Social History Narrative   Not on file   Social Determinants of Health   Financial Resource Strain: Low Risk  (01/30/2023)   Overall Financial Resource Strain (CARDIA)    Difficulty of Paying Living Expenses: Not hard at all  Food Insecurity: No Food Insecurity (01/28/2023)   Hunger Vital Sign    Worried About Running Out of Food in the Last Year: Never true    Ran Out of Food in the Last Year: Never true  Transportation Needs: No Transportation Needs (01/30/2023)   PRAPARE - Administrator, Civil Service (Medical): No    Lack of Transportation (Non-Medical): No  Physical Activity: Not on file  Stress: Not on file  Social Connections: Not on file  Intimate Partner Violence: Not At Risk (01/28/2023)   Humiliation, Afraid, Rape, and Kick questionnaire    Fear of Current or Ex-Partner: No    Emotionally Abused: No    Physically Abused: No    Sexually Abused: No      Family History  Problem Relation Age of Onset   Hypertension Mother    Alzheimer's disease Mother     Vitals:   02/07/23 1446 02/07/23 1531  BP: (!) 182/94 (!) 162/101  Pulse: 93   SpO2: 99%   Weight: 93.4 kg (206 lb)     PHYSICAL EXAM: ReDs 20%  General:  Well appearing. No respiratory difficulty HEENT: normal Neck: supple. no JVD. Carotids 2+ bilat; no bruits. No lymphadenopathy or thryomegaly appreciated. Cor: PMI nondisplaced. Regular rate & rhythm. No rubs, gallops  or murmurs. Lungs: clear Abdomen: soft, nontender, nondistended. No hepatosplenomegaly. No bruits or masses. Good bowel sounds. Extremities: no cyanosis, clubbing, rash, edema Neuro: alert & oriented x 3, cranial  nerves grossly intact. moves all 4 extremities w/o difficulty. Affect pleasant.  ECG: not performed    ASSESSMENT & PLAN:  1. Systolic Heart Failure - Think primarily ischemic CM though LBBB may also be contributing - Echo 10/24 EF 35-40% w/ significant LV dyssynchrony, due to LBBB. RV normal. QRS 144 ms on EKG. - LHC 10/24 w/ obstructive LAD and Diag dz, now s/p PCI. Hopefully EF will improved w/   revascularization. Recommend repeat echo in 3 months.  If c/w significant dyssynchrony and or reduced EF, would recommend CRT device (D vs P depending on EF)  - NYHA Class II. Euvolemic on exam and by ReDs, 20%. Does not need daily loop diuretic - Continue Farxiga 10 mg daily  - Increase Spironolactone to 25 mg daily  - on Irbesartan 150 mg daily. Will continue for now and checking patient assistance for Entresto. Application started today. If approved, recommend transition at f/u cardiology appt  - continue Toprol XL 100 mg daily  - check BMP and BNP today   2. CAD - LHC 10/24 w/ 80% stenosis of mid LAD and 70% stenosis of a sidebranch of the second diagonal. She underwent successful PCI/DES to LAD and balloon angioplasty of the second diagonal - stable w/o ischemic CP  - continue DAPT w/ ASA + Brilinta, statin + ? blocker  3. LBBB - noted on 10/24 EKGs, new from prior tracings in 2017 - LHC w/ obstructive LAD/diag disease, now s/p PCI  - if c/w significant dyssynchrony/ reduced EF on f/u echo in 3 months, would recommend CRT device(D vs P depending on EF)   4. Hypertension  - elevated in clinic today but reports typically controlled - GDMT per above, increase spiro to 25 mg daily  - pt instructed to keep a daily log of home BP readings and bring to f/u cardiology appt next wek - as noted above, would like to get on Entresto if approved for pt assistance  - BMP today    5. HLD - on atorva 40 - recent LDL controlled at 42 mg/dL  - cardiology to follow   6. Type 2 DM  - recent Hgb  A1c 6.9  - on Metformin, Farxiga and Januvia   7. Indigestion/ Atypical CP  - I think GI etiology. She is on DAPT but denies melena/hematochezia but recent noted drop in Hgb. Will check CBC today  - continue PPI therapy. Recommended that she discuss w/ PCP tomorrow either up titration of Prilosec vs switch to Protonix - recommended she takes meds w/ meals   Referred to HFSW (PCP, Medications, Transportation, ETOH Abuse, Drug Abuse, Insurance, Financial ): No Refer to Pharmacy: Yes  Refer to Home Health:  No Refer to Advanced Heart Failure Clinic: No  Refer to General Cardiology: Yes (Dr. Tresa Endo, has APP appt next wk)  Follow up: keep f/u w/ cardiology next wk  Robbie Lis, PA-C 02/07/2023

## 2023-02-07 NOTE — Progress Notes (Signed)
ReDS Vest / Clip - 02/07/23 1446       ReDS Vest / Clip   Station Marker C    Ruler Value 35    ReDS Value Range Low volume    ReDS Actual Value 20

## 2023-02-07 NOTE — Telephone Encounter (Signed)
Patient contacted regarding discharge from [facility] on 01/31/23  Patient understands to follow up with provider Azalee Course on 02/14/23 at 10:55 at [facility]. Patient understands discharge instructions? Yes Patient understands medications and regiment? Yes Patient understands to bring all medications to this visit? yes

## 2023-02-11 LAB — LAB REPORT - SCANNED: EGFR: 67

## 2023-02-13 NOTE — Progress Notes (Unsigned)
Office Visit    Patient Name: Leslie [name] Date of Encounter: 02/14/2023  Primary Care Provider:  Ignatius Specking, MD Primary Cardiologist:  Nicki Guadalajara, MD  Chief Complaint    67 y.o. female with history of coronary artery disease, systolic heart failure hypertension, hyperlipidemia, type 2 diabetes mellitus, GERD, LBBB.  Past Medical History  Subjective   Past Medical History:  Diagnosis Date   Arthritis    Asthma    Cancer (HCC)    skin cancer   Diabetes mellitus without complication (HCC)    Type 2 NIDDM x 3 years   GERD (gastroesophageal reflux disease)    Hernia, ventral    History of kidney stones    Hyperlipidemia    Hypertension    on meds for 2 years   PONV (postoperative nausea and vomiting)    Past Surgical History:  Procedure Laterality Date   ABDOMINAL HYSTERECTOMY  2001   BREAST BIOPSY Left    CATARACT EXTRACTION Left    CATARACT EXTRACTION W/PHACO Right 11/16/2015   Procedure: CATARACT EXTRACTION PHACO AND INTRAOCULAR LENS PLACEMENT RIGHT EYE; CDE:  8.65;  Surgeon: Gemma Payor, MD;  Location: [facility];  Service: Ophthalmology;  Laterality: Right;   CHOLECYSTECTOMY  1991   CORONARY STENT INTERVENTION N/A 01/30/2023   Procedure: CORONARY STENT INTERVENTION;  Surgeon: Marykay Lex, MD;  Location: [facility] INVASIVE CV LAB;  Service: Cardiovascular;  Laterality: N/A;   HERNIA REPAIR  2002   inscisional hernia x4   INCISION AND DRAINAGE ABSCESS N/A 07/02/2012   Procedure: INCISION AND DRAINAGE ABDOMINAL WALL ABSCESS;  Surgeon: Cherylynn Ridges, MD;  Location: [facility] OR;  Service: General;  Laterality: N/A;   kidney stones  2000   cystoscopy with stone removal   LEFT HEART CATH AND CORONARY ANGIOGRAPHY N/A 01/30/2023   Procedure: LEFT HEART CATH AND CORONARY ANGIOGRAPHY;  Surgeon: Marykay Lex, MD;  Location: [facility] INVASIVE CV LAB;  Service: Cardiovascular;  Laterality: N/A;   TONSILLECTOMY  1967    Allergies  Allergies  Allergen Reactions    Hydrochlorothiazide Other (See Comments)    Caused High sugars   Morphine And Codeine Nausea And Vomiting   Tape Other (See Comments)    And bandaids also, causes skin rawness and tears   Lisinopril Cough      History of Present Illness      67 y.o. y/o female with history of coronary artery disease, systolic heart failure hypertension, hyperlipidemia, type 2 diabetes mellitus, GERD, LBBB.  She presented to the ED 01/27/2023 with complaints of chest pain with radiation to arms, back, and jaw.  EKG showed new left bundle branch 5 and high-sensitivity troponin peaked at 140.  She underwent cardiac cath on 10/28 showed 80% stenosis of mid LAD and 70% stenosis of sidebranch of second diagonal she had successful PCI/DES to LAD and balloon angioplasty to second diagonal.  Echocardiogram with LVEF 30-40%, global hypokinesis, significant LV dyssynchrony due to LBBB.  She was started on spironolactone 12.5 mg daily and Farxiga 10 mg daily.  Her home amlodipine was discontinued.  It was considered to switch irbesartan to [facility] but patient noted she is in the donut hole therefore due to concern for cost this was deferred.  Her simvastatin was stopped and Lipitor 40 mg daily was started in favor high-sensitivity statin.  Seen by heart failure clinic on 02/07/2023.  Her spironolactone was increased to 25 mg daily due to BP of 182/94, 162/101.   Today: She notes that  she is doing well from cardiac perspective.  She denies any exertional angina.  Denies any further chest, neck, back pain.  She denies shortness of breath of breath, very mild DOE which she claims it is related to the Brilinta.  She also notes history of chronic resting dyspnea.  She notes that she has had some mild GERD however her PCP switched her omeprazole to pantoprazole which has helped.  She is able to do all ADLs without issue.  Her current weight is stable.  She is not experiencing any lower extremity edema or abdominal distention.  She has  not been able to participate in cardiac rehab due to cost.  We discussed several exercises she can do at home.  She is concerned about the cost of her medications such as [place] and Brilinta.  She denies any melena, hemoptysis, hematochezia.   Objective  Home Medications    Current Outpatient Medications  Medication Sig Dispense Refill   albuterol (VENTOLIN HFA) 108 (90 Base) MCG/ACT inhaler SMARTSIG:1 Puff(s) By Mouth 4 Times Daily PRN     aspirin EC 81 MG tablet Take 81 mg by mouth daily.     cetirizine (ZYRTEC) 10 MG tablet Take 10 mg by mouth daily.     ferrous gluconate (FERGON) 324 MG tablet Take 324 mg by mouth 2 (two) times daily with a meal.     glipiZIDE (GLUCOTROL) 10 MG tablet Take 10 mg by mouth in the morning and at bedtime. Take 10mg  in AM and 5mg  in PM     metFORMIN (GLUCOPHAGE) 500 MG tablet Take 500 mg by mouth 2 (two) times daily with a meal. Take 2 tablets in AM and 1 tablet in PM     nitroGLYCERIN (NITROSTAT) 0.4 MG SL tablet Place 1 tablet (0.4 mg total) under the tongue every 5 (five) minutes as needed for chest pain. 25 tablet 2   pantoprazole (PROTONIX) 40 MG tablet Take 40 mg by mouth daily.     sacubitril-valsartan (ENTRESTO) 49-51 MG Take 1 tablet by mouth 2 (two) times daily. 60 tablet 5   atorvastatin (LIPITOR) 40 MG tablet Take 1 tablet (40 mg total) by mouth daily. 90 tablet 3   dapagliflozin propanediol (FARXIGA) 10 MG TABS tablet Take 1 tablet (10 mg total) by mouth daily. 90 tablet 0   metoprolol succinate (TOPROL-XL) 100 MG 24 hr tablet Take 1 tablet (100 mg total) by mouth daily. Take with or immediately following a meal. 90 tablet 3   omeprazole (PRILOSEC) 20 MG capsule Take 20 mg by mouth in the morning and at bedtime.     spironolactone (ALDACTONE) 25 MG tablet Take 1 tablet (25 mg total) by mouth daily. 90 tablet 3   ticagrelor (BRILINTA) 90 MG TABS tablet Take 1 tablet (90 mg total) by mouth 2 (two) times daily. 60 tablet 11   No current  facility-administered medications for this visit.     Physical Exam    VS:  BP 120/64 (BP Location: Left Arm, Patient Position: Sitting, Cuff Size: Normal)   Pulse 87   Ht 5\' 3"  (1.6 m)   Wt 204 lb 9.6 oz (92.8 kg)   SpO2 98%   BMI 36.24 kg/m  , BMI Body mass index is 36.24 kg/m.       GEN: Well nourished, well developed, in no acute distress. HEENT: normal. Neck: Supple, no JVD, carotid bruits, or masses. Cardiac: RRR, no murmurs, rubs, or gallops. No clubbing, cyanosis, edema.  Radials 2+/PT 2+ and equal  bilaterally.  Respiratory:  Respirations regular and unlabored, clear to auscultation bilaterally. GI: Soft, nontender, nondistended, BS + x 4. MS: no deformity or atrophy. Skin: warm and dry, no rash. Neuro:  Strength and sensation are intact. Psych: Normal affect.  Accessory Clinical Findings    ECG personally reviewed by me today - EKG Interpretation Date/Time:  Tuesday February 14 2023 11:28:56 EST Ventricular Rate:  87 PR Interval:  158 QRS Duration:  146 QT Interval:  406 QTC Calculation: 488 R Axis:   -20  Text Interpretation: Normal sinus rhythm Left bundle branch block Confirmed by Rise Paganini [phone]) on 02/14/2023 12:23:15 PM  - no acute changes.  Lab Results  Component Value Date   WBC 10.2 01/31/2023   HGB 11.1 (L) 01/31/2023   HCT 35.3 (L) 01/31/2023   MCV 85.1 01/31/2023   PLT 284 01/31/2023   Lab Results  Component Value Date   CREATININE 0.89 01/31/2023   BUN 11 01/31/2023   NA 139 01/31/2023   K 3.9 01/31/2023   CL 102 01/31/2023   CO2 26 01/31/2023   Lab Results  Component Value Date   ALT 23 11/12/2014   AST 29 11/12/2014   ALKPHOS 77 11/12/2014   BILITOT 0.3 11/12/2014   Lab Results  Component Value Date   CHOL 123 01/29/2023   HDL 43 01/29/2023   LDLCALC 42 01/29/2023   TRIG 190 (H) 01/29/2023   CHOLHDL 2.9 01/29/2023    Lab Results  Component Value Date   HGBA1C 6.9 (H) 01/28/2023   No results found for: "TSH"      Assessment & Plan    1.  Systolic heart failure / ischemic cardiomyopathy -Echo 10/24 EF 35-40%, global hypokinesis, with significant LV dyssynchrony due to LBBB, RV normal -NYHA class II.  Euvolemic and well compensated on exam.  She continues not to require a loop diuretic -Plan to start Entresto 49-51mg  twice daily to maximize GDMT.  Patient given 30-day free coupon.  Pharm.D. to pursue grant for ongoing medication assistance for both [place].  Discontinue irbesartan.  -Continue GDMT Farxiga 10mg , spironolactone 25mg , metoprolol XL 100mg  -BMP and BNP are both currently pending -Plan for repeat echocardiogram x 3 months, to be addressed on follow-up -If EF does not improve, recommend CRT device given significant dyssynchrony -Less than 2 g sodium restriction, compression stockings, daily dry weights  2.  Coronary artery disease - LHC 01/2023: Obstructive to LAD and Diag, PCI/DES to LAD and balloon angioplasty to second diagonal -Stable with no anginal symptoms, no indication for ischemic evaluation  -Continue DAPT ASA 81 mg, Brilinta 90 mg twice daily -Continue metoprolol XL 100 mg -Encouraged Mediterranean diet  3.  LBBB -First noted on 10/24 EKG, EKG today with ongoing LBBB HR 87bpm -If repeat echo shows significant dyssynchrony and further reduced EF, recommend CRT device per HF clinic recommendations  4.  Hypertension -BP today 120/64 -She brought in her home blood pressure log that showed average BP at home 130s over 80s -Continue GDMT as noted above, addition of Entresto 49-51mg  -Continue daily blood pressure logs  5.  Hyperlipidemia -Lipoprotein (a) 18.3 -LDL 42 on 01/29/2023 -Continue atorvastatin 40 mg  6.  T2DM -A1c 6.9 on 01/28/2023 -On metformin, glipizide, Farxiga -Continue follow-up with PCP      Dispo: Follow-up in 3 months  Denyce Robert, [place], AGNP-C 02/14/2023, 1:34 PM

## 2023-02-14 ENCOUNTER — Ambulatory Visit: Payer: Medicare HMO | Attending: Physician Assistant | Admitting: Emergency Medicine

## 2023-02-14 ENCOUNTER — Encounter: Payer: Self-pay | Admitting: Emergency Medicine

## 2023-02-14 VITALS — BP 120/64 | HR 87 | Ht 63.0 in | Wt 204.6 lb

## 2023-02-14 DIAGNOSIS — I10 Essential (primary) hypertension: Secondary | ICD-10-CM | POA: Diagnosis not present

## 2023-02-14 DIAGNOSIS — I2510 Atherosclerotic heart disease of native coronary artery without angina pectoris: Secondary | ICD-10-CM

## 2023-02-14 DIAGNOSIS — I447 Left bundle-branch block, unspecified: Secondary | ICD-10-CM | POA: Diagnosis not present

## 2023-02-14 DIAGNOSIS — E119 Type 2 diabetes mellitus without complications: Secondary | ICD-10-CM | POA: Diagnosis not present

## 2023-02-14 DIAGNOSIS — E785 Hyperlipidemia, unspecified: Secondary | ICD-10-CM | POA: Diagnosis not present

## 2023-02-14 DIAGNOSIS — I5022 Chronic systolic (congestive) heart failure: Secondary | ICD-10-CM

## 2023-02-14 DIAGNOSIS — I255 Ischemic cardiomyopathy: Secondary | ICD-10-CM

## 2023-02-14 MED ORDER — DAPAGLIFLOZIN PROPANEDIOL 10 MG PO TABS: 10.0000 mg | ORAL_TABLET | Freq: Every day | ORAL | 0 refills | Status: DC

## 2023-02-14 MED ORDER — ATORVASTATIN CALCIUM 40 MG PO TABS: 40.0000 mg | ORAL_TABLET | Freq: Every day | ORAL | 3 refills | Status: DC

## 2023-02-14 MED ORDER — ENTRESTO 49-51 MG PO TABS: 1.0000 | ORAL_TABLET | Freq: Two times a day (BID) | ORAL | 5 refills | Status: DC

## 2023-02-14 MED ORDER — SPIRONOLACTONE 25 MG PO TABS: 25.0000 mg | ORAL_TABLET | Freq: Every day | ORAL | 3 refills | Status: DC

## 2023-02-14 MED ORDER — TICAGRELOR 90 MG PO TABS: 90.0000 mg | ORAL_TABLET | Freq: Two times a day (BID) | ORAL | 11 refills | Status: DC

## 2023-02-14 MED ORDER — METOPROLOL SUCCINATE ER 100 MG PO TB24: 100.0000 mg | ORAL_TABLET | Freq: Every day | ORAL | 3 refills | Status: DC

## 2023-02-14 NOTE — Patient Instructions (Addendum)
Medication Instructions:  STOP IRBESARTAN  START ENTRESTO 49-51 MG TWICE DAILY.  *If you need a refill on your cardiac medications before your next appointment, please call your pharmacy*   Lab Work: NO LABS If you have labs (blood work) drawn today and your tests are completely normal, you will receive your results only by: MyChart Message (if you have MyChart) OR A paper copy in the mail If you have any lab test that is abnormal or we need to change your treatment, we will call you to review the results.   Testing/Procedures:- in 3 months Your physician has requested that you have an echocardiogram. Echocardiography is a painless test that uses sound waves to create images of your heart. It provides your doctor with information about the size and shape of your heart and how well your heart's chambers and valves are working. This procedure takes approximately one hour. There are no restrictions for this procedure. Please do NOT wear cologne, perfume, aftershave, or lotions (deodorant is allowed). Please arrive 15 minutes prior to your appointment time.  Please note: We ask at that you not bring children with you during ultrasound (echo/ vascular) testing. Due to room size and safety concerns, children are not allowed in the ultrasound rooms during exams. Our front office staff cannot provide observation of children in our lobby area while testing is being conducted. An adult accompanying a patient to their appointment will only be allowed in the ultrasound room at the discretion of the ultrasound technician under special circumstances. We apologize for any inconvenience.    Follow-Up: At [facility], you and your health needs are our priority.  As part of our continuing mission to provide you with exceptional heart care, we have created designated Provider Care Teams.  These Care Teams include your primary Cardiologist (physician) and Advanced Practice Providers (APPs -  Physician  Assistants and Nurse Practitioners) who all work together to provide you with the care you need, when you need it.    Your next appointment:   3 month(s)  Provider:   Azalee Course PA

## 2023-02-14 NOTE — Telephone Encounter (Signed)
I have not signed the pt up for a grant, as the forms were signed inpatient and this patient has not been assigned to one of my providers.   I do think a [employer] grant would be the best option for the [place]. The [place] application is still in the media tab, if someone at the NP office is able to print that and complete the application. The [place] MD/RX form can be tossed, but the Brilinta app should be ready to send once the office info and provider signature are completed.  Thank you so much for following up and checking in!

## 2023-02-14 NOTE — Telephone Encounter (Signed)
Leslie [name] - patient was in office today seeing NP.  They want to start her on Entresto as well as the [place].  Cost is going to be a concern, and I want to be sure I'm not duplicating any of your work.  For the Farxiga, did you sign her up for a [employer] grant or do paperwork to get her assistance from the manufacturer?  We can do [employer] grants to get both [place] and Sherryll Burger covered - they'll pay her copay for a full year.  I can't get any grants for the Brilinta, so that would still need to go through [place].

## 2023-02-15 ENCOUNTER — Encounter: Payer: Self-pay | Admitting: Pharmacist Clinician (PhC)/ Clinical Pharmacy Specialist

## 2023-02-16 ENCOUNTER — Other Ambulatory Visit: Payer: Self-pay | Admitting: Internal Medicine

## 2023-02-16 DIAGNOSIS — Z1231 Encounter for screening mammogram for malignant neoplasm of breast: Secondary | ICD-10-CM

## 2023-03-08 ENCOUNTER — Ambulatory Visit
Admission: RE | Admit: 2023-03-08 | Discharge: 2023-03-08 | Disposition: A | Discharge status: Home / Self Care | Payer: Medicare HMO | Source: Ambulatory Visit

## 2023-03-08 DIAGNOSIS — Z1231 Encounter for screening mammogram for malignant neoplasm of breast: Secondary | ICD-10-CM

## 2023-03-13 ENCOUNTER — Other Ambulatory Visit: Payer: Self-pay

## 2023-03-13 MED ORDER — DAPAGLIFLOZIN PROPANEDIOL 10 MG PO TABS: 10.0000 mg | ORAL_TABLET | Freq: Every day | ORAL | 3 refills | Status: DC

## 2023-04-03 DIAGNOSIS — H5203 Hypermetropia, bilateral: Secondary | ICD-10-CM | POA: Diagnosis not present

## 2023-04-03 DIAGNOSIS — H52223 Regular astigmatism, bilateral: Secondary | ICD-10-CM | POA: Diagnosis not present

## 2023-05-17 ENCOUNTER — Ambulatory Visit: Payer: Medicare HMO | Attending: Emergency Medicine

## 2023-05-17 DIAGNOSIS — I5022 Chronic systolic (congestive) heart failure: Secondary | ICD-10-CM | POA: Diagnosis not present

## 2023-05-18 LAB — ECHOCARDIOGRAM COMPLETE
AR max vel: 1.88 cm2
AV Area VTI: 1.82 cm2
AV Area mean vel: 1.99 cm2
AV Mean grad: 5 mm[Hg]
AV Peak grad: 8.4 mm[Hg]
Ao pk vel: 1.45 m/s
Area-P 1/2: 4.83 cm2
Calc EF: 40.8 %
Est EF: 35
MV VTI: 2.7 cm2
S' Lateral: 3.6 cm
Single Plane A2C EF: 43.9 %
Single Plane A4C EF: 39.7 %

## 2023-05-19 ENCOUNTER — Encounter: Payer: Self-pay | Admitting: Physician Assistant

## 2023-05-19 ENCOUNTER — Ambulatory Visit: Payer: Medicare HMO | Attending: Physician Assistant | Admitting: Physician Assistant

## 2023-05-19 VITALS — BP 122/70 | HR 88 | Wt 203.0 lb

## 2023-05-19 DIAGNOSIS — I447 Left bundle-branch block, unspecified: Secondary | ICD-10-CM | POA: Diagnosis not present

## 2023-05-19 DIAGNOSIS — I420 Dilated cardiomyopathy: Secondary | ICD-10-CM

## 2023-05-19 DIAGNOSIS — I10 Essential (primary) hypertension: Secondary | ICD-10-CM

## 2023-05-19 DIAGNOSIS — Z79899 Other long term (current) drug therapy: Secondary | ICD-10-CM

## 2023-05-19 DIAGNOSIS — E785 Hyperlipidemia, unspecified: Secondary | ICD-10-CM | POA: Diagnosis not present

## 2023-05-19 DIAGNOSIS — I2510 Atherosclerotic heart disease of native coronary artery without angina pectoris: Secondary | ICD-10-CM

## 2023-05-19 NOTE — Progress Notes (Signed)
 Cardiology Office Note:  .   Date:  05/21/2023  ID:  Leslie [name], DOB 1955-10-12, MRN [id number] PCP: Ignatius Specking, MD  [facility] Providers Cardiologist:  Nicki Guadalajara, MD Cardiology APP:  Corrin Parker, PA-C     History of Present Illness: .   Leslie [name] is a 68 y.o. female with PMH of HTN, HLD, DM II and CAD.  She was admitted in October 2024 with NSTEMI after presented with chest pain that radiated to her back and jaw and arms bilaterally.  Serial hs troponin 9-->46-->109-->140.  Echocardiogram obtained on 01/29/2023 showed EF 35 to 40%, significant LV dyssynchrony due to LBBB, no significant valve issue.  Chest x-ray showed minimal linear atelectasis or scarring of the left midlung.  Subsequent cardiac catheterization performed on 01/22/2023 showed 80% mid LAD lesion treated with Synergy XD DES, 70% stenosis in the sidebranch of second diagonal treated with balloon angioplasty.  She has otherwise minimal CAD in the remainder of the coronary system.  Postprocedure, patient was placed on aspirin and Brilinta.  Patient is also started on irbesartan, metoprolol succinate, and spironolactone.  The plan was to repeat echocardiogram in 3 months.  Patient was switched to [facility] on 02/14/2023.  Echocardiogram was repeated on 05/17/2023 which showed EF 35%, mild LVH, grade 1 DD, significant LV dyssynchrony.   Patient presents today for follow-up.  She is a on appropriate cardiac medications including metoprolol succinate, Entresto, spironolactone and [place].  She is doing well on the therapy.  She denies any significant shortness of breath and chest discomfort.  Interestingly, despite the fact her troponin elevation was not too impressive during that NSTEMI, her EF has been persistently low at 35%.  Since EF is greater than 35%, I do not think she would qualify for CRT-D therapy.  However since the degree of CAD was not critical and that there was no significant troponin elevation, I  suspect the underlying left bundle branch block may be contributing to the persistently low EF.  I reviewed his case with DOD Dr. Rennis Golden who was also agreeable with the EP's referral.  She can follow-up with Dr. Tresa Endo in 93-month.  ROS:   She denies chest pain, palpitations, dyspnea, pnd, orthopnea, n, v, dizziness, syncope, edema, weight gain, or early satiety. All other systems reviewed and are otherwise negative except as noted above.    Studies Reviewed: .        Cardiac Studies & Procedures   ______________________________________________________________________________________________ CARDIAC CATHETERIZATION  CARDIAC CATHETERIZATION 01/30/2023  Narrative   Mid LAD lesion is 80% stenosed with 70% stenosed side branch in 2nd Diag.   Balloon angioplasty was performed on the 2nd Diag ostium, using a BALLOON TAKERU 1.5X12.  Post intervention, the side branch was reduced to 55% residual stenosis.   A drug-eluting stent was successfully placed using a SYNERGY XD 2.25X2 -> deployed to 2.4 mm, postdilated proximally to 2.6 mm. Post intervention, there is a 0% residual stenosis.   --------------------------------------   Otherwise minimal CAD in the remainder of the coronary system.   --------------------------------------   LV end diastolic pressure is mildly elevated.   There is no aortic valve stenosis.  POST-CATH DIAGNOSES Single-vessel CAD with extensive LAD calcification and a segmental 70-85% stenosis in the mid LAD beginning at and involving ostium of small caliber 2nd Diag. Successful bifurcation PCI of the LAD and 2nd Diag using a Synergy XD DES 2.25 mm x 24 mm deployed to 2.4 mm and postdilated proximally to 2.6 mm-reducing the  lesion segment to 0% PTCA of ostial 2nd Diag sidebranch with 1.0 mm balloon to maintain flow Otherwise large caliber codominant LCx that terminates as a large posterolateral OM branch and large caliber RCA that terminates as a posterior descending artery  with a small PL branch. Reduced EF by echo, mildly increased LVEDP.   RECOMMENDATIONS In the absence of any other complications or medical issues, we expect the patient to be ready for discharge from an interventional cardiology perspective on 01/30/2023. Recommend uninterrupted dual antiplatelet therapy with Aspirin 81mg  daily and Clopidogrel 75mg  daily for a minimum of 12 months (ACS-Class I recommendation). Would convert to Brilinta 60 mg twice daily or Plavix 75 mg daily SAPT after 1 year. For urgent procedures, could potentially hold DAPT temporarily at 6 months. For significant bleeding or bruising, could stop aspirin at 3-6 months if remains on Brilinta. I have held her ARB dose for today since she is borderline hypotensive in the Cath Lab.  Can consider restarting tomorrow depending on blood pressure assessment prior to discharge. Otherwise continue to titrate CAD GDMT as tolerated.    Bryan Lemma, MD  Findings Coronary Findings Diagnostic  Dominance: Right  Left Anterior Descending Vessel is moderate in size. There is mild diffuse disease throughout the vessel. The vessel is moderately calcified. Most of the calcium appears to be extraluminal Mid LAD lesion is 80% stenosed with 70% stenosed side branch in 2nd Diag. Vessel is the culprit lesion. The lesion is type B1, located at the bifurcation, segmental, eccentric and irregular. The lesion is moderately calcified. Please contact patient for 70%- 85% back to 70%  First Diagonal Branch Vessel is small in size.  Second Diagonal Branch Vessel is small in size. There is mild disease in the vessel.  Second Septal Branch Vessel is small in size.  Left Circumflex Vessel is large.  First Obtuse Marginal Branch Vessel is small in size.  Third Obtuse Marginal Branch Vessel is small in size.  Fourth Obtuse Marginal Branch Vessel is small in size.  Left Posterior Atrioventricular Artery Vessel is small in size.  Right  Coronary Artery Vessel was injected. Vessel is large. Vessel is angiographically normal. The vessel is mildly tortuous.  Acute Marginal Branch Vessel is small in size.  Right Ventricular Branch Vessel is small in size.  Inferior Septal Vessel is small in size.  First Right Posterolateral Branch Vessel is small in size.  Intervention  Mid LAD lesion with side branch in 2nd Diag Stent - Main Branch Lesion length:  23 mm. CATH LAUNCHER 6FR EBU3.5 guide catheter was inserted. Lesion crossed with guidewire using a WIRE ASAHI PROWATER 180CM. Pre-stent angioplasty was performed using a BALLN SAPPHIRE 2.0X20. Maximum pressure:  8 atm. Inflation time: 20 sec. A drug-eluting stent was successfully placed using a SYNERGY XD 2.25X24. Maximum pressure: 16 atm. Inflation time: 30 sec. Stent strut is well apposed. Deployed to 2.4 mm,  Post-stent angioplasty was performed using a BALLN  EMERGE MR 2.5X20. Maximum pressure:  16 atm. Inflation time:  20 sec. postdilated 2.6 mm in the proximal 20 mm Flow in the sidebranch TIMI-3 Angioplasty - Side Branch Lesion length:  8 mm. CATH LAUNCHER 6FR EBU3.5 guide catheter was inserted. WIRE RUNTHROUGH .[id number] guidewire used to cross lesion. Balloon angioplasty was performed using a BALLOON TAKERU 1.5X12. Maximum pressure: 6 atm. Inflation time: 20 sec. Plan was for provisional stenting, however after ballooning, the ostium of the sidebranch appeared to be somewhat restricted and therefore chose to pretreat prior to stenting  the LAD. Post-Intervention Lesion Assessment The intervention was successful. Pre-interventional TIMI flow is 3. Post-intervention TIMI flow is 3. Treated lesion length:  24 mm. No complications occurred at this lesion. There is a 0% residual stenosis in the main branch post intervention. There is a 55% residual stenosis in the side branch post intervention.     ECHOCARDIOGRAM  ECHOCARDIOGRAM COMPLETE  05/17/2023  Narrative ECHOCARDIOGRAM REPORT    Patient Name:   Leslie [name] Date of Exam: 05/17/2023 Medical Rec #:  [id number]    Height:       63.0 in Accession #:    [id number]   Weight:       204.6 lb Date of Birth:  Aug 05, 1955    BSA:          1.952 m Patient Age:    67 years     BP:           120/64 mmHg Patient Gender: F            HR:           85 bpm. Exam Location:  [place]  Procedure: 2D Echo, Cardiac Doppler and Color Doppler (Both Spectral and Color Flow Doppler were utilized during procedure).  Indications:    I50.22 Chronic systolic (congestive) heart failure  History:        Patient has prior history of Echocardiogram examinations, most recent 01/29/2023. CAD and Previous Myocardial Infarction, Arrythmias:LBBB and Bradycardia; Risk Factors:Morbid obesity, Hypertension, Diabetes, Dyslipidemia, Sleep Apnea and Former Smoker.  Sonographer:    Dominica Severin RCS, RVS Referring Phys: [id number] MADISON L FOUNTAIN   Sonographer Comments: Technically difficult study due to poor echo windows. Image acquisition challenging due to patient body habitus. IV unsuccessful for use of definity IMPRESSIONS   1. Left ventricular ejection fraction, by estimation, is 35%. The left ventricle has moderately decreased function. The left ventricle demonstrates global hypokinesis. There is mild left ventricular hypertrophy. Left ventricular diastolic parameters are consistent with Grade I diastolic dysfunction (impaired relaxation). 2. Right ventricular systolic function is normal. The right ventricular size is normal. Tricuspid regurgitation signal is inadequate for assessing PA pressure. 3. The mitral valve is normal in structure. No evidence of mitral valve regurgitation. No evidence of mitral stenosis. 4. The aortic valve is tricuspid. Aortic valve regurgitation is not visualized. No aortic stenosis is present. 5. The inferior vena cava is normal in size with greater than 50% respiratory  variability, suggesting right atrial pressure of 3 mmHg.  Comparison(s): EF 35-40%. Significiant LV dyssynchrony.  FINDINGS Left Ventricle: Left ventricular ejection fraction, by estimation, is 35%. The left ventricle has moderately decreased function. The left ventricle demonstrates global hypokinesis. The left ventricular internal cavity size was normal in size. There is mild left ventricular hypertrophy. Left ventricular diastolic parameters are consistent with Grade I diastolic dysfunction (impaired relaxation). Normal left ventricular filling pressure.  Right Ventricle: The right ventricular size is normal. Right vetricular wall thickness was not well visualized. Right ventricular systolic function is normal. Tricuspid regurgitation signal is inadequate for assessing PA pressure.  Left Atrium: Left atrial size was normal in size.  Right Atrium: Right atrial size was normal in size.  Pericardium: There is no evidence of pericardial effusion.  Mitral Valve: The mitral valve is normal in structure. No evidence of mitral valve regurgitation. No evidence of mitral valve stenosis. MV peak gradient, 3.4 mmHg. The mean mitral valve gradient is 2.0 mmHg.  Tricuspid Valve: The tricuspid valve is normal in structure. Tricuspid valve regurgitation  is not demonstrated. No evidence of tricuspid stenosis.  Aortic Valve: The aortic valve is tricuspid. Aortic valve regurgitation is not visualized. No aortic stenosis is present. Aortic valve mean gradient measures 5.0 mmHg. Aortic valve peak gradient measures 8.4 mmHg. Aortic valve area, by VTI measures 1.82 cm.  Pulmonic Valve: The pulmonic valve was not well visualized. Pulmonic valve regurgitation is not visualized. No evidence of pulmonic stenosis.  Aorta: The aortic root and ascending aorta are structurally normal, with no evidence of dilitation.  Venous: The inferior vena cava is normal in size with greater than 50% respiratory variability,  suggesting right atrial pressure of 3 mmHg.  IAS/Shunts: No atrial level shunt detected by color flow Doppler.  Additional Comments: 3D was performed not requiring image post processing on an independent workstation and was abnormal.   LEFT VENTRICLE PLAX 2D LVIDd:         4.30 cm     Diastology LVIDs:         3.60 cm     LV e' medial:    4.13 cm/s LV PW:         1.10 cm     LV E/e' medial:  15.8 LV IVS:        1.10 cm     LV e' lateral:   5.66 cm/s LVOT diam:     2.00 cm     LV E/e' lateral: 11.5 LV SV:         50 LV SV Index:   25 LVOT Area:     3.14 cm  LV Volumes (MOD) LV vol d, MOD A2C: 46.7 ml LV vol d, MOD A4C: 30.7 ml LV vol s, MOD A2C: 26.2 ml LV vol s, MOD A4C: 18.5 ml LV SV MOD A2C:     20.5 ml LV SV MOD A4C:     30.7 ml LV SV MOD BP:      15.4 ml  RIGHT VENTRICLE RV Mid diam:    2.40 cm RV S prime:     11.40 cm/s TAPSE (M-mode): 2.2 cm  LEFT ATRIUM           Index        RIGHT ATRIUM           Index LA Vol (A2C): 24.3 ml 12.45 ml/m  RA Area:     10.20 cm LA Vol (A4C): 12.8 ml 6.56 ml/m   RA Volume:   20.80 ml  10.65 ml/m AORTIC VALVE                     PULMONIC VALVE AV Area (Vmax):    1.88 cm      PV Vmax:       0.95 m/s AV Area (Vmean):   1.99 cm      PV Peak grad:  3.6 mmHg AV Area (VTI):     1.82 cm AV Vmax:           145.00 cm/s AV Vmean:          101.000 cm/s AV VTI:            0.273 m AV Peak Grad:      8.4 mmHg AV Mean Grad:      5.0 mmHg LVOT Vmax:         86.80 cm/s LVOT Vmean:        64.000 cm/s LVOT VTI:          0.158 m LVOT/AV VTI ratio: 0.58  AORTA Ao Root diam: 3.10 cm Ao Asc diam:  3.30 cm  MITRAL VALVE MV Area (PHT): 4.83 cm    SHUNTS MV Area VTI:   2.70 cm    Systemic VTI:  0.16 m MV Peak grad:  3.4 mmHg    Systemic Diam: 2.00 cm MV Mean grad:  2.0 mmHg MV Vmax:       0.93 m/s MV Vmean:      57.1 cm/s MV Decel Time: 157 msec MV E velocity: 65.10 cm/s MV A velocity: 98.10 cm/s MV E/A ratio:  0.66  Dina Rich MD Electronically signed by Dina Rich MD Signature Date/Time: 05/18/2023/12:58:04 PM    Final          ______________________________________________________________________________________________      Risk Assessment/Calculations:            Physical Exam:   VS:  BP 122/70 (BP Location: Left Arm, Patient Position: Sitting)   Pulse 88   Wt 203 lb (92.1 kg)   SpO2 96%   BMI 35.96 kg/m    Wt Readings from Last 3 Encounters:  05/19/23 203 lb (92.1 kg)  02/14/23 204 lb 9.6 oz (92.8 kg)  02/07/23 206 lb (93.4 kg)    GEN: Well nourished, well developed in no acute distress NECK: No JVD; No carotid bruits CARDIAC: RRR, no murmurs, rubs, gallops RESPIRATORY:  Clear to auscultation without rales, wheezing or rhonchi  ABDOMEN: Soft, non-tender, non-distended EXTREMITIES:  No edema; No deformity   ASSESSMENT AND PLAN: .     Dilated Cardiomyopathy Persistent EF of 35% despite revascularization and optimal medical therapy. Noted left bundle branch block (LBBB) which may be contributing to the low EF. No significant symptoms of heart failure reported. -Refer to Electrophysiology for evaluation of LBBB  -Continue current medications:Metoprolol succinate, Entresto, Spironolactone, and Farxiga. -Order Basal Metabolic Panel today. -Follow up with Dr. Tresa Endo in 4 months.  Left bundle branch block: See above, suspect left bundle branch block is contributing to the persistent low EF.  Will refer to EP service  Coronary Artery Disease History of 80% stenosis in LAD and 70% stenosis in second diagonal branch. Treated with stent placement and balloon angioplasty respectively. -Continue current medications:Metoprolol succinate, Entresto, Spironolactone, and [place].  Hypertension: Blood pressure stable  Hyperlipidemia: On Lipitor 40 mg daily.         Dispo: Follow-up with Dr. Tresa Endo in 4 month  Signed, Azalee Course, [place]

## 2023-05-19 NOTE — Patient Instructions (Signed)
Medication Instructions:  NO CHANGES *If you need a refill on your cardiac medications before your next appointment, please call your pharmacy*   Lab Work: BMP TODAY If you have labs (blood work) drawn today and your tests are completely normal, you will receive your results only by: MyChart Message (if you have MyChart) OR A paper copy in the mail If you have any lab test that is abnormal or we need to change your treatment, we will call you to review the results.   Testing/Procedures: NO TESTING   Follow-Up: At [facility], you and your health needs are our priority.  As part of our continuing mission to provide you with exceptional heart care, we have created designated Provider Care Teams.  These Care Teams include your primary Cardiologist (physician) and Advanced Practice Providers (APPs -  Physician Assistants and Nurse Practitioners) who all work together to provide you with the care you need, when you need it.   Your next appointment:   4 month(s)  Provider:   Nicki Guadalajara, MD   Other Instructions You have been referred to Electrophysiology Services

## 2023-05-20 LAB — BASIC METABOLIC PANEL
BUN/Creatinine Ratio: 15 (ref 12–28)
BUN: 14 mg/dL (ref 8–27)
CO2: 19 mmol/L — ABNORMAL LOW (ref 20–29)
Calcium: 10.1 mg/dL (ref 8.7–10.3)
Chloride: 103 mmol/L (ref 96–106)
Creatinine, Ser: 0.91 mg/dL (ref 0.57–1.00)
Glucose: 192 mg/dL — ABNORMAL HIGH (ref 70–99)
Potassium: 5.4 mmol/L — ABNORMAL HIGH (ref 3.5–5.2)
Sodium: 140 mmol/L (ref 134–144)
eGFR: 69 mL/min/{1.73_m2} (ref >59)

## 2023-05-25 ENCOUNTER — Telehealth: Payer: Self-pay

## 2023-05-25 ENCOUNTER — Other Ambulatory Visit: Payer: Self-pay

## 2023-05-25 DIAGNOSIS — Z79899 Other long term (current) drug therapy: Secondary | ICD-10-CM

## 2023-05-25 MED ORDER — SPIRONOLACTONE 25 MG PO TABS: 12.5000 mg | ORAL_TABLET | Freq: Every day | ORAL | 3 refills | Status: DC

## 2023-05-25 NOTE — Telephone Encounter (Signed)
 Patient was returning call. Please advise ?

## 2023-05-25 NOTE — Telephone Encounter (Addendum)
 Called patient regarding results left vm for patient to give office a call back, will also mailed patient copy of results  ----- Message from Azalee Course sent at 05/23/2023  8:44 AM EST ----- Stable renal function and electrolyte. Potassium borderline high. Reduce spironolactone to 12.5mg  daily. Repeat BMET in 2 weeks to make sure potassium return to normal.

## 2023-05-25 NOTE — Telephone Encounter (Signed)
 Patient identification verified by 2 forms. Marilynn Rail, RN    Called and spoke to patient  Relayed result message below  Patient aware:  -Rx sent to preferred pharmacy  -to present to lab in 2 weeks  Patient verbalized understanding, no questions at this time

## 2023-06-06 DIAGNOSIS — H2640 Unspecified secondary cataract: Secondary | ICD-10-CM | POA: Diagnosis not present

## 2023-06-06 DIAGNOSIS — H04123 Dry eye syndrome of bilateral lacrimal glands: Secondary | ICD-10-CM | POA: Diagnosis not present

## 2023-06-06 DIAGNOSIS — H16223 Keratoconjunctivitis sicca, not specified as Sjogren's, bilateral: Secondary | ICD-10-CM | POA: Diagnosis not present

## 2023-06-06 DIAGNOSIS — H18593 Other hereditary corneal dystrophies, bilateral: Secondary | ICD-10-CM | POA: Diagnosis not present

## 2023-06-16 DIAGNOSIS — Z79899 Other long term (current) drug therapy: Secondary | ICD-10-CM | POA: Diagnosis not present

## 2023-06-17 LAB — BASIC METABOLIC PANEL
BUN/Creatinine Ratio: 15 (ref 12–28)
BUN: 13 mg/dL (ref 8–27)
CO2: 15 mmol/L — ABNORMAL LOW (ref 20–29)
Calcium: 9.2 mg/dL (ref 8.7–10.3)
Chloride: 104 mmol/L (ref 96–106)
Creatinine, Ser: 0.86 mg/dL (ref 0.57–1.00)
Glucose: 310 mg/dL — ABNORMAL HIGH (ref 70–99)
Potassium: 4.6 mmol/L (ref 3.5–5.2)
Sodium: 138 mmol/L (ref 134–144)
eGFR: 74 mL/min/{1.73_m2} (ref >59)

## 2023-06-22 ENCOUNTER — Ambulatory Visit: Payer: Medicare HMO | Admitting: Cardiology

## 2023-06-22 NOTE — Progress Notes (Deleted)
 Electrophysiology Office Note:    Date:  06/22/2023   ID:  Leslie [name], Leslie [name] 19-Jan-1956, MRN [id number]  [facility] Cardiologist:  Nicki Guadalajara, MD  [facility] Electrophysiologist:  Lanier Prude, MD   Referring MD: Azalee Course, [place]   Chief Complaint: Chronic systolic heart failure  History of Present Illness:    Leslie [name] is a 68 year old woman who I am seeing today for an evaluation of chronic systolic heart failure and left bundle branch block at the request of Azalee Course, PA-C.  The patient was last seen May 19, 2023.  She has a history of hypertension, hyperlipidemia, diabetes and coronary artery disease.  She was admitted in October 2024 with an NSTEMI.  She was noted to have a reduced ejection fraction.  She received PCI to her mid LAD lesion.  She had a repeat echo in February of this year which showed an ejection fraction of 35% and significant LV dyssynchrony.      Their past medical, social and family history was reviewed.   ROS:   Please see the history of present illness.    All other systems reviewed and are negative.  EKGs/Labs/Other Studies Reviewed:    The following studies were reviewed today:  May 17, 2023 echo personally reviewed EF 35% Global hypokinesis Dyssynchrony consistent with left bundle right block  February 14, 2023 EKG shows sinus rhythm, left bundle branch block, QRS duration 154 ms.       Physical Exam:    VS:  There were no vitals taken for this visit.    Wt Readings from Last 3 Encounters:  05/19/23 203 lb (92.1 kg)  02/14/23 204 lb 9.6 oz (92.8 kg)  02/07/23 206 lb (93.4 kg)     GEN: no distress CARD: RRR, No MRG RESP: No IWOB. CTAB.        ASSESSMENT AND PLAN:    No diagnosis found.  #Chronic systolic heart failure NYHA class II-III.  Warm and dry on exam.  Last EF 35% although windows were technically difficult during study.  Given her history of coronary artery disease and left bundle branch  block, I would like to get a cardiac MRI prior to implanting an ICD to help further risk stratify her and quantify LGE burden.  If EF is less than or equal to 35%, favor proceeding with CRT-D implant.  I did discuss the CRT implant procedure in detail with the patient during today's clinic appointment.  She is interested in proceeding if EF remains low on cardiac MRI.  Continue spironolactone, Entresto, metoprolol, Farxiga.  She will stay on aspirin uninterrupted around the time of the ICD implant if we go that route.  If we proceed with ICD implant she will hold her ticagrelor for 3 days.***This has been discussed with Dr. Herbie Baltimore who performed her PCI in October 2024. Would wait until 6 mo mark after PCI to hold Ticagrelor (April 28).  -------------  The patient has an ischemic CM (EF 35%), NYHA Class III CHF, and CAD.  He is referred by Azalee Course, PA-C for risk stratification of sudden death and consideration of ICD implantation.  At this time, she meets criteria for ICD implantation for primary prevention of sudden death.  I have had a thorough discussion with the patient reviewing options.  The patient and their family (if available) have had opportunities to ask questions and have them answered. The patient and I have decided together through a shared decision making process to proceed with ICD implant  at this time.    Risks, benefits, alternatives to ICD implantation were discussed in detail with the patient today. The patient understands that the risks include but are not limited to bleeding, infection, pneumothorax, perforation, tamponade, vascular damage, renal failure, MI, stroke, death, inappropriate shocks, and lead dislodgement and wishes to proceed.  We will therefore schedule device implantation at the next available time.  Plan for Med[employer] CRT-D.  #Coronary artery disease No ischemic symptoms today Continue aspirin and ticagrelor as above.  If proceed with ICD, favor holding  ticagrelor for 3 days prior to ICD implant as long as we are at the 47-month mark post PCI.   Signed, Rossie Muskrat. Lalla Brothers, MD, [facility], [facility] 06/22/2023 5:14 AM    Electrophysiology [facility]

## 2023-07-12 NOTE — Progress Notes (Unsigned)
 Electrophysiology Office Note:    Date:  07/12/2023   ID:  Shaquel, Josephson [birth year], MRN [id number]  [facility] Cardiologist:  Nicki Guadalajara, MD  [facility] Electrophysiologist:  Lanier Prude, MD   Referring MD: Azalee Course, [place]   Chief Complaint: Chronic systolic heart failure  History of Present Illness:    Ms. Feria is a 68 year old woman who I am seeing today for an evaluation of chronic systolic heart failure and left bundle branch block at the request of Azalee Course, PA-C.  The patient was last seen May 19, 2023.  She has a history of hypertension, hyperlipidemia, diabetes and coronary artery disease.  She was admitted in October 2024 with an NSTEMI.  She was noted to have a reduced ejection fraction.  She received PCI to her mid LAD lesion.  She had a repeat echo in February of this year which showed an ejection fraction of 35% and significant LV dyssynchrony.      Their past medical, social and family history was reviewed.   ROS:   Please see the history of present illness.    All other systems reviewed and are negative.  EKGs/Labs/Other Studies Reviewed:    The following studies were reviewed today:  May 17, 2023 echo personally reviewed EF 35% Global hypokinesis Dyssynchrony consistent with left bundle right block  February 14, 2023 EKG shows sinus rhythm, left bundle branch block, QRS duration 154 ms.       Physical Exam:    VS:  There were no vitals taken for this visit.    Wt Readings from Last 3 Encounters:  05/19/23 203 lb (92.1 kg)  02/14/23 204 lb 9.6 oz (92.8 kg)  02/07/23 206 lb (93.4 kg)     GEN: no distress CARD: RRR, No MRG RESP: No IWOB. CTAB.        ASSESSMENT AND PLAN:    No diagnosis found.  #Chronic systolic heart failure NYHA class II-III.  Warm and dry on exam.  Last EF 35% although windows were technically difficult during study.  Given her history of coronary artery disease and left bundle branch block,  I would like to get a cardiac MRI prior to implanting an ICD to help further risk stratify her and quantify LGE burden.  If EF is less than or equal to 35%, favor proceeding with CRT-D implant.  I did discuss the CRT implant procedure in detail with the patient during today's clinic appointment.  She is interested in proceeding if EF remains low on cardiac MRI.  Continue spironolactone, Entresto, metoprolol, Farxiga.  She will stay on aspirin uninterrupted around the time of the ICD implant if we go that route.  If we proceed with ICD implant she will hold her ticagrelor for 3 days.***This has been discussed with Dr. Herbie Baltimore who performed her PCI in October 2024. Would wait until 6 mo mark after PCI to hold Ticagrelor (April 28).  -------------  The patient has an ischemic CM (EF 35%), NYHA Class III CHF, and CAD.  He is referred by Azalee Course, PA-C for risk stratification of sudden death and consideration of ICD implantation.  At this time, she meets criteria for ICD implantation for primary prevention of sudden death.  I have had a thorough discussion with the patient reviewing options.  The patient and their family (if available) have had opportunities to ask questions and have them answered. The patient and I have decided together through a shared decision making process to proceed with ICD implant  at this time.    Risks, benefits, alternatives to ICD implantation were discussed in detail with the patient today. The patient understands that the risks include but are not limited to bleeding, infection, pneumothorax, perforation, tamponade, vascular damage, renal failure, MI, stroke, death, inappropriate shocks, and lead dislodgement and wishes to proceed.  We will therefore schedule device implantation at the next available time.  Plan for Medtronic CRT-D.  #Coronary artery disease No ischemic symptoms today Continue aspirin and ticagrelor as above.  If proceed with ICD, favor holding ticagrelor for  3 days prior to ICD implant as long as we are at the 67-month mark post PCI.   Signed, Rossie Muskrat. Lalla Brothers, MD, [facility], [facility] 07/12/2023 9:31 PM    Electrophysiology [facility]

## 2023-07-13 ENCOUNTER — Ambulatory Visit: Attending: Cardiology | Admitting: Cardiology

## 2023-07-13 ENCOUNTER — Encounter: Payer: Self-pay | Admitting: Cardiology

## 2023-07-13 VITALS — BP 118/66 | HR 91 | Ht 63.0 in | Wt 200.8 lb

## 2023-07-13 DIAGNOSIS — I2510 Atherosclerotic heart disease of native coronary artery without angina pectoris: Secondary | ICD-10-CM

## 2023-07-13 DIAGNOSIS — I447 Left bundle-branch block, unspecified: Secondary | ICD-10-CM

## 2023-07-13 DIAGNOSIS — I255 Ischemic cardiomyopathy: Secondary | ICD-10-CM | POA: Diagnosis not present

## 2023-07-13 DIAGNOSIS — I5022 Chronic systolic (congestive) heart failure: Secondary | ICD-10-CM

## 2023-07-13 DIAGNOSIS — Z9861 Coronary angioplasty status: Secondary | ICD-10-CM | POA: Diagnosis not present

## 2023-07-13 NOTE — Patient Instructions (Addendum)
 Medication Instructions:  Your physician recommends that you continue on your current medications as directed. Please refer to the Current Medication list given to you today.  *If you need a refill on your cardiac medications before your next appointment, please call your pharmacy*  Lab Work: BMET and CBC - these can be done at any [facility] location within 30 days of your procedure  Testing/Procedures: Cardiac MRI Your physician has requested that you have a cardiac MRI. Cardiac MRI uses a computer to create images of your heart as its beating, producing both still and moving pictures of your heart and major blood vessels. For further information please visit [email or web address]. Please follow the instruction sheet given to you today for more information.  ICD Implant Your physician has recommended that you have a defibrillator inserted. An implantable cardioverter defibrillator (ICD) is a small device that is placed in your chest or, in rare cases, your abdomen. This device uses electrical pulses or shocks to help control life-threatening, irregular heartbeats that could lead the heart to suddenly stop beating (sudden cardiac arrest). Leads are attached to the ICD that goes into your heart. This is done in the hospital and usually requires an overnight stay. Please see the instruction sheet given to you today for more information.  Follow-Up: At [facility], you and your health needs are our priority.  As part of our continuing mission to provide you with exceptional heart care, our providers are all part of one team.  This team includes your primary Cardiologist (physician) and Advanced Practice Providers or APPs (Physician Assistants and Nurse Practitioners) who all work together to provide you with the care you need, when you need it.  Your next appointment:   We will call you to schedule your post-procedure appointments     You are scheduled for Cardiac MRI at the location below.   Please arrive for your appointment at ______________ . ?  [facility] [address] [place], [place] [address] Please take advantage of the free valet parking available at the [facility] and [employer] (Entrance C).  Proceed to the [facility] Radiology Department (First Floor) for check-in.   OR   [facility] [address] [place], [place] [address] Please go to the [facility] and check-in with the desk attendant.   Magnetic resonance imaging (MRI) is a painless test that produces images of the inside of the body without using Xrays.  During an MRI, strong magnets and radio waves work together in a Data processing manager to form detailed images.   MRI images may provide more details about a medical condition than X-rays, CT scans, and ultrasounds can provide.  You may be given earphones to listen for instructions.  You may eat a light breakfast and take medications as ordered with the exception of furosemide, hydrochlorothiazide, chlorthalidone or spironolactone (or any other fluid pill). If you are undergoing a stress MRI, please avoid stimulants for 12 hr prior to test. (I.e. Caffeine, nicotine, chocolate, or antihistamine medications)  If your provider has ordered anti-anxiety medications for this test, then you will need a driver.  An IV will be inserted into one of your veins. Contrast material will be injected into your IV. It will leave your body through your urine within a day. You may be told to drink plenty of fluids to help flush the contrast material out of your system.  You will be asked to remove all metal, including: Watch, jewelry, and other metal objects  including hearing aids, hair pieces and dentures. Also wearable glucose monitoring systems (ie. Freestyle Libre and Omnipods) (Braces and fillings normally are not a problem.)   TEST WILL TAKE APPROXIMATELY 1 HOUR  PLEASE NOTIFY SCHEDULING AT LEAST 24 HOURS IN ADVANCE IF YOU  ARE UNABLE TO KEEP YOUR APPOINTMENT. [phone]  For more information and frequently asked questions, please visit our website : [email or web address]  Please call the Cardiac Imaging Nurse Navigators with any questions/concerns. [phone] Office      1st Floor: - Lobby - Registration  - Pharmacy  - Lab - Cafe  2nd Floor: - PV Lab - Diagnostic Testing (echo, CT, nuclear med)  3rd Floor: - Vacant  4th Floor: - TCTS (cardiothoracic surgery) - AFib Clinic - Structural Heart Clinic - Vascular Surgery  - Vascular Ultrasound  5th Floor: - HeartCare Cardiology (general and EP) - Clinical Pharmacy for coumadin, hypertension, lipid, weight-loss medications, and med management appointments    Valet parking services will be available as well.

## 2023-07-13 NOTE — Addendum Note (Signed)
 Addended by: Frutoso Schatz on: 07/13/2023 02:29 PM   Modules accepted: Orders

## 2023-07-27 ENCOUNTER — Other Ambulatory Visit: Payer: Self-pay

## 2023-07-27 MED ORDER — ENTRESTO 49-51 MG PO TABS: 1.0000 | ORAL_TABLET | Freq: Two times a day (BID) | ORAL | 3 refills | Status: DC

## 2023-08-04 ENCOUNTER — Encounter (HOSPITAL_COMMUNITY): Payer: Self-pay

## 2023-08-08 ENCOUNTER — Other Ambulatory Visit: Payer: Self-pay | Admitting: Cardiology

## 2023-08-08 ENCOUNTER — Ambulatory Visit (HOSPITAL_COMMUNITY)
Admission: RE | Admit: 2023-08-08 | Discharge: 2023-08-08 | Disposition: A | Discharge status: Home / Self Care | Source: Ambulatory Visit | Attending: Cardiology | Admitting: Cardiology

## 2023-08-08 DIAGNOSIS — Z9861 Coronary angioplasty status: Secondary | ICD-10-CM | POA: Insufficient documentation

## 2023-08-08 DIAGNOSIS — I447 Left bundle-branch block, unspecified: Secondary | ICD-10-CM

## 2023-08-08 DIAGNOSIS — I2510 Atherosclerotic heart disease of native coronary artery without angina pectoris: Secondary | ICD-10-CM

## 2023-08-08 DIAGNOSIS — I5022 Chronic systolic (congestive) heart failure: Secondary | ICD-10-CM | POA: Diagnosis not present

## 2023-08-08 DIAGNOSIS — I255 Ischemic cardiomyopathy: Secondary | ICD-10-CM

## 2023-08-08 MED ORDER — GADOBUTROL 1 MMOL/ML IV SOLN
12.0000 mL | Freq: Once | INTRAVENOUS | Status: AC | PRN
  Administered 2023-08-08: 12 mL via INTRAVENOUS

## 2023-08-09 ENCOUNTER — Telehealth (HOSPITAL_COMMUNITY): Payer: Self-pay

## 2023-08-09 ENCOUNTER — Encounter: Payer: Self-pay | Admitting: Cardiology

## 2023-08-09 NOTE — Telephone Encounter (Signed)
 Attempted to reach patient to discuss upcoming procedure, no answer. Left VM for patient to return call.

## 2023-08-10 NOTE — Telephone Encounter (Signed)
 Spoke with patient to complete pre-procedure call.     New medical conditions?  No Recent hospitalizations or surgeries? No Started any new medications? No Patient made aware to contact office to inform of any new medications started. Any changes in activities of daily living? No  Pre-procedure testing scheduled: lab work by 5/23.  Confirmed patient is scheduled for Biventricular implantable cardioverter defibrillator on Monday, June 2 with Dr. Harvie Liner. Instructed patient to arrive at the Main Entrance A at [facility]: [address] [place], [place] [address] and check in at Admitting at 12:00 PM.  Advised of plan to go home the same day and will only stay overnight if medically necessary. You MUST have a responsible adult to drive you home and MUST be with you the first 24 hours after you arrive home or your procedure could be cancelled.  Patient verbalized understanding to information provided and is agreeable to proceed with procedure.

## 2023-08-15 ENCOUNTER — Other Ambulatory Visit: Payer: Self-pay

## 2023-08-15 DIAGNOSIS — I2510 Atherosclerotic heart disease of native coronary artery without angina pectoris: Secondary | ICD-10-CM | POA: Diagnosis not present

## 2023-08-15 DIAGNOSIS — I447 Left bundle-branch block, unspecified: Secondary | ICD-10-CM | POA: Diagnosis not present

## 2023-08-15 DIAGNOSIS — I255 Ischemic cardiomyopathy: Secondary | ICD-10-CM | POA: Diagnosis not present

## 2023-08-15 DIAGNOSIS — I5022 Chronic systolic (congestive) heart failure: Secondary | ICD-10-CM | POA: Diagnosis not present

## 2023-08-15 DIAGNOSIS — Z9861 Coronary angioplasty status: Secondary | ICD-10-CM | POA: Diagnosis not present

## 2023-08-15 LAB — CBC WITH DIFFERENTIAL/PLATELET
Basophils Absolute: 0 10*3/uL (ref 0.0–0.2)
Basos: 0 %
EOS (ABSOLUTE): 0 10*3/uL (ref 0.0–0.4)
Eos: 0 %
Hematocrit: 40.2 % (ref 34.0–46.6)
Hemoglobin: 12.8 g/dL (ref 11.1–15.9)
Immature Grans (Abs): 0.2 10*3/uL — ABNORMAL HIGH (ref 0.0–0.1)
Immature Granulocytes: 1 %
Lymphocytes Absolute: 1.7 10*3/uL (ref 0.7–3.1)
Lymphs: 10 %
MCH: 29.4 pg (ref 26.6–33.0)
MCHC: 31.8 g/dL (ref 31.5–35.7)
MCV: 92 fL (ref 79–97)
Monocytes Absolute: 0.4 10*3/uL (ref 0.1–0.9)
Monocytes: 2 %
Neutrophils Absolute: 14 10*3/uL — ABNORMAL HIGH (ref 1.4–7.0)
Neutrophils: 87 %
Platelets: 402 10*3/uL (ref 150–450)
RBC: 4.36 x10E6/uL (ref 3.77–5.28)
RDW: 13.6 % (ref 11.7–15.4)
WBC: 16.3 10*3/uL — ABNORMAL HIGH (ref 3.4–10.8)

## 2023-08-16 ENCOUNTER — Ambulatory Visit: Payer: Self-pay

## 2023-08-16 ENCOUNTER — Telehealth: Payer: Self-pay

## 2023-08-16 DIAGNOSIS — Z79899 Other long term (current) drug therapy: Secondary | ICD-10-CM

## 2023-08-16 DIAGNOSIS — I255 Ischemic cardiomyopathy: Secondary | ICD-10-CM

## 2023-08-16 DIAGNOSIS — I5022 Chronic systolic (congestive) heart failure: Secondary | ICD-10-CM

## 2023-08-16 LAB — BASIC METABOLIC PANEL WITH GFR
BUN/Creatinine Ratio: 21 (ref 12–28)
BUN: 19 mg/dL (ref 8–27)
CO2: 19 mmol/L — ABNORMAL LOW (ref 20–29)
Calcium: 10.4 mg/dL — ABNORMAL HIGH (ref 8.7–10.3)
Chloride: 103 mmol/L (ref 96–106)
Creatinine, Ser: 0.9 mg/dL (ref 0.57–1.00)
Glucose: 219 mg/dL — ABNORMAL HIGH (ref 70–99)
Potassium: 5.8 mmol/L (ref 3.5–5.2)
Sodium: 140 mmol/L (ref 134–144)
eGFR: 70 mL/min/{1.73_m2} (ref >59)

## 2023-08-16 NOTE — Telephone Encounter (Signed)
 Left message for patient to call back

## 2023-08-16 NOTE — Telephone Encounter (Signed)
 Pt returning call

## 2023-08-16 NOTE — Telephone Encounter (Signed)
 Spoke with the patient and reviewed lab results and recommendations from Dr. Marven Slimmer. Patient verbalized understanding. Labs have been ordered.

## 2023-08-16 NOTE — Telephone Encounter (Signed)
 Potassium of 5.8. Per Dr. Marven Slimmer patient needs to stop spironolactone , hydrate and repeat labs tomorrow 5/15.

## 2023-08-18 ENCOUNTER — Telehealth: Payer: Self-pay | Admitting: Cardiology

## 2023-08-18 DIAGNOSIS — Z79899 Other long term (current) drug therapy: Secondary | ICD-10-CM | POA: Diagnosis not present

## 2023-08-18 DIAGNOSIS — I255 Ischemic cardiomyopathy: Secondary | ICD-10-CM | POA: Diagnosis not present

## 2023-08-18 DIAGNOSIS — I5022 Chronic systolic (congestive) heart failure: Secondary | ICD-10-CM | POA: Diagnosis not present

## 2023-08-18 NOTE — Telephone Encounter (Signed)
 5:10 PM  I reached Leslie [name] by telephone today.  She is doing well.  She confirmed that she stopped her spironolactone  and got blood work drawn this afternoon at [facility].  Results are still pending.  Donelda Fujita T. Marven Slimmer, MD, [facility], [facility] Cardiac Electrophysiology

## 2023-08-19 LAB — BASIC METABOLIC PANEL WITH GFR
BUN/Creatinine Ratio: 22 (ref 12–28)
BUN: 19 mg/dL (ref 8–27)
CO2: 20 mmol/L (ref 20–29)
Calcium: 10.1 mg/dL (ref 8.7–10.3)
Chloride: 103 mmol/L (ref 96–106)
Creatinine, Ser: 0.85 mg/dL (ref 0.57–1.00)
Glucose: 232 mg/dL — ABNORMAL HIGH (ref 70–99)
Potassium: 5.2 mmol/L (ref 3.5–5.2)
Sodium: 139 mmol/L (ref 134–144)
eGFR: 75 mL/min/{1.73_m2} (ref >59)

## 2023-08-20 ENCOUNTER — Ambulatory Visit: Payer: Self-pay | Admitting: Cardiology

## 2023-08-30 ENCOUNTER — Telehealth (HOSPITAL_COMMUNITY): Payer: Self-pay

## 2023-08-30 NOTE — Telephone Encounter (Signed)
 Attempted to reach patient to discuss upcoming procedure, no answer. Left VM for patient to return call.

## 2023-08-31 NOTE — Telephone Encounter (Addendum)
 Patient returned call to discuss upcoming procedure.   Confirmed patient is scheduled for a Biventricular implantable cardioverter defibrillator on Monday, June 2 with Dr. Harvie Liner. Instructed patient to arrive at the Main Entrance A at [facility]: [address] [place], [place] [address] and check in at Admitting at 12:30 PM.   Labs completed  Any recent signs of acute illness or been started on antibiotics? No Any new medications started? No Any medications to hold? Hold Brilinta  and  Farxiga  for 3 days prior to your procedure- last dose May 29. Hold Glipizide the evening dose prior to procedure and the morning of procedure. Patient currently has Spironolactone  on hold.  Medication instructions:  On the morning of your procedure, you may take all other medications not discussed with a sip of water.   No eating or drinking after midnight prior to procedure.   The night before your procedure and the morning of your procedure, wash thoroughly with the CHG surgical soap from the neck down, paying special attention to the area where your procedure will be performed.  Advised of plan to go home the same day and will only stay overnight if medically necessary. You MUST have a responsible adult to drive you home and MUST be with you the first 24 hours after you arrive home.  Patient verbalized understanding to all instructions provided and agreed to proceed with procedure.

## 2023-09-01 NOTE — Pre-Procedure Instructions (Signed)
 Attempted to call patient regarding procedure instructions.  Left voicemail on the following items: Arrival time 1230 Nothing to eat or drink after midnight No meds AM of procedure Responsible person to drive you home and stay with you for 24 hrs Wash with special soap night before and morning of procedure If on anti-coagulant drug instructions Brilintia- last dose 5/29

## 2023-09-04 ENCOUNTER — Encounter (HOSPITAL_COMMUNITY): Payer: Self-pay | Admitting: Cardiology

## 2023-09-04 ENCOUNTER — Ambulatory Visit (HOSPITAL_COMMUNITY)
Admission: RE | Disposition: A | Discharge status: Home / Self Care | Payer: Self-pay | Source: Home / Self Care | Attending: Cardiology

## 2023-09-04 ENCOUNTER — Other Ambulatory Visit: Payer: Self-pay

## 2023-09-04 ENCOUNTER — Ambulatory Visit (HOSPITAL_COMMUNITY)
Admission: RE | Admit: 2023-09-04 | Discharge: 2023-09-05 | Disposition: A | Discharge status: Home / Self Care | Attending: Cardiology | Admitting: Cardiology

## 2023-09-04 DIAGNOSIS — I5022 Chronic systolic (congestive) heart failure: Secondary | ICD-10-CM | POA: Diagnosis not present

## 2023-09-04 DIAGNOSIS — Z7982 Long term (current) use of aspirin: Secondary | ICD-10-CM | POA: Insufficient documentation

## 2023-09-04 DIAGNOSIS — Z7902 Long term (current) use of antithrombotics/antiplatelets: Secondary | ICD-10-CM | POA: Insufficient documentation

## 2023-09-04 DIAGNOSIS — E785 Hyperlipidemia, unspecified: Secondary | ICD-10-CM | POA: Insufficient documentation

## 2023-09-04 DIAGNOSIS — I252 Old myocardial infarction: Secondary | ICD-10-CM | POA: Insufficient documentation

## 2023-09-04 DIAGNOSIS — I447 Left bundle-branch block, unspecified: Secondary | ICD-10-CM | POA: Diagnosis not present

## 2023-09-04 DIAGNOSIS — I2510 Atherosclerotic heart disease of native coronary artery without angina pectoris: Secondary | ICD-10-CM | POA: Insufficient documentation

## 2023-09-04 DIAGNOSIS — I110 Hypertensive heart disease with heart failure: Secondary | ICD-10-CM | POA: Insufficient documentation

## 2023-09-04 DIAGNOSIS — Z95810 Presence of automatic (implantable) cardiac defibrillator: Secondary | ICD-10-CM | POA: Diagnosis present

## 2023-09-04 DIAGNOSIS — E119 Type 2 diabetes mellitus without complications: Secondary | ICD-10-CM | POA: Insufficient documentation

## 2023-09-04 DIAGNOSIS — Z955 Presence of coronary angioplasty implant and graft: Secondary | ICD-10-CM | POA: Diagnosis not present

## 2023-09-04 DIAGNOSIS — Z79899 Other long term (current) drug therapy: Secondary | ICD-10-CM | POA: Diagnosis not present

## 2023-09-04 DIAGNOSIS — I255 Ischemic cardiomyopathy: Secondary | ICD-10-CM | POA: Insufficient documentation

## 2023-09-04 DIAGNOSIS — Z7984 Long term (current) use of oral hypoglycemic drugs: Secondary | ICD-10-CM | POA: Diagnosis not present

## 2023-09-04 HISTORY — PX: BIV ICD INSERTION CRT-D: EP1195

## 2023-09-04 HISTORY — PX: LEAD INSERTION: EP1212

## 2023-09-04 LAB — GLUCOSE, CAPILLARY
Glucose-Capillary: 135 mg/dL — ABNORMAL HIGH (ref 70–99)
Glucose-Capillary: 136 mg/dL — ABNORMAL HIGH (ref 70–99)

## 2023-09-04 MED ORDER — MIDAZOLAM HCL 5 MG/5ML IJ SOLN
INTRAMUSCULAR | Status: DC | PRN
  Administered 2023-09-04 (×3): 1 mg via INTRAVENOUS

## 2023-09-04 MED ORDER — ACETAMINOPHEN 325 MG PO TABS
325.0000 mg | ORAL_TABLET | ORAL | Status: DC | PRN
  Administered 2023-09-04: 650 mg via ORAL
  Filled 2023-09-04: qty 2

## 2023-09-04 MED ORDER — MIDAZOLAM HCL 5 MG/5ML IJ SOLN
INTRAMUSCULAR | Status: AC
  Filled 2023-09-04: qty 5

## 2023-09-04 MED ORDER — SACUBITRIL-VALSARTAN 49-51 MG PO TABS
1.0000 | ORAL_TABLET | Freq: Two times a day (BID) | ORAL | Status: DC
  Administered 2023-09-04 – 2023-09-05 (×2): 1 via ORAL
  Filled 2023-09-04 (×2): qty 1

## 2023-09-04 MED ORDER — CEFAZOLIN SODIUM-DEXTROSE 2-4 GM/100ML-% IV SOLN
INTRAVENOUS | Status: AC
  Administered 2023-09-04: 2 g via INTRAVENOUS
  Filled 2023-09-04: qty 100

## 2023-09-04 MED ORDER — ONDANSETRON HCL 4 MG/2ML IJ SOLN
4.0000 mg | Freq: Four times a day (QID) | INTRAMUSCULAR | Status: DC | PRN
  Administered 2023-09-05: 4 mg via INTRAVENOUS
  Filled 2023-09-04: qty 2

## 2023-09-04 MED ORDER — LIDOCAINE HCL (PF) 1 % IJ SOLN
INTRAMUSCULAR | Status: DC | PRN
  Administered 2023-09-04: 50 mL

## 2023-09-04 MED ORDER — LIDOCAINE HCL 1 % IJ SOLN
INTRAMUSCULAR | Status: AC
Start: 2023-09-04 — End: ?
  Filled 2023-09-04: qty 40

## 2023-09-04 MED ORDER — SODIUM CHLORIDE 0.9 % IV SOLN: INTRAVENOUS | Status: DC

## 2023-09-04 MED ORDER — ASPIRIN 81 MG PO TBEC
81.0000 mg | DELAYED_RELEASE_TABLET | Freq: Every day | ORAL | Status: DC
  Administered 2023-09-05: 81 mg via ORAL
  Filled 2023-09-04 (×2): qty 1

## 2023-09-04 MED ORDER — LIDOCAINE HCL (PF) 1 % IJ SOLN
INTRAMUSCULAR | Status: AC
Start: 2023-09-04 — End: ?
  Filled 2023-09-04: qty 30

## 2023-09-04 MED ORDER — METOPROLOL SUCCINATE ER 100 MG PO TB24
100.0000 mg | ORAL_TABLET | Freq: Every day | ORAL | Status: DC
  Administered 2023-09-05: 100 mg via ORAL
  Filled 2023-09-04: qty 1

## 2023-09-04 MED ORDER — ONDANSETRON HCL 4 MG/2ML IJ SOLN
INTRAMUSCULAR | Status: DC | PRN
  Administered 2023-09-04: 4 mg via INTRAVENOUS

## 2023-09-04 MED ORDER — IOHEXOL 350 MG/ML SOLN
INTRAVENOUS | Status: DC | PRN
  Administered 2023-09-04 (×2): 5 mL

## 2023-09-04 MED ORDER — HEPARIN (PORCINE) IN NACL 1000-0.9 UT/500ML-% IV SOLN
INTRAVENOUS | Status: DC | PRN
  Administered 2023-09-04: 500 mL

## 2023-09-04 MED ORDER — ONDANSETRON HCL 4 MG/2ML IJ SOLN
INTRAMUSCULAR | Status: AC
  Filled 2023-09-04: qty 2

## 2023-09-04 MED ORDER — FENTANYL CITRATE (PF) 100 MCG/2ML IJ SOLN
INTRAMUSCULAR | Status: AC
Start: 2023-09-04 — End: ?
  Filled 2023-09-04: qty 2

## 2023-09-04 MED ORDER — PANTOPRAZOLE SODIUM 40 MG PO TBEC
40.0000 mg | DELAYED_RELEASE_TABLET | Freq: Every day | ORAL | Status: DC
  Administered 2023-09-04: 40 mg via ORAL
  Filled 2023-09-04: qty 1

## 2023-09-04 MED ORDER — ATORVASTATIN CALCIUM 40 MG PO TABS
40.0000 mg | ORAL_TABLET | Freq: Every day | ORAL | Status: DC
  Administered 2023-09-04: 40 mg via ORAL
  Filled 2023-09-04: qty 1

## 2023-09-04 MED ORDER — CHLORHEXIDINE GLUCONATE 4 % EX SOLN
4.0000 | Freq: Once | CUTANEOUS | Status: DC
Start: 2023-09-04 — End: 2023-09-04
  Filled 2023-09-04: qty 60

## 2023-09-04 MED ORDER — POVIDONE-IODINE 10 % EX SWAB
2.0000 | Freq: Once | CUTANEOUS | Status: AC
Start: 2023-09-04 — End: 2023-09-04
  Administered 2023-09-04: 2 via TOPICAL

## 2023-09-04 MED ORDER — SODIUM CHLORIDE 0.9 % IV SOLN
INTRAVENOUS | Status: AC
  Administered 2023-09-04: 80 mg
  Filled 2023-09-04: qty 2

## 2023-09-04 MED ORDER — SODIUM CHLORIDE 0.9 % IV SOLN: 80.0000 mg | INTRAVENOUS | Status: AC

## 2023-09-04 MED ORDER — FENTANYL CITRATE (PF) 100 MCG/2ML IJ SOLN
INTRAMUSCULAR | Status: DC | PRN
  Administered 2023-09-04 (×5): 25 ug via INTRAVENOUS

## 2023-09-04 MED ORDER — CEFAZOLIN SODIUM-DEXTROSE 2-4 GM/100ML-% IV SOLN: 2.0000 g | INTRAVENOUS | Status: AC

## 2023-09-04 NOTE — Progress Notes (Addendum)
   09/04/23 2041  Vitals  Temp 97.6 F (36.4 C)  Temp Source Oral  BP 131/65  MAP (mmHg) 83  BP Location Right Arm  BP Method Automatic  Patient Position (if appropriate) Lying  Pulse Rate Source Monitor  ECG Heart Rate 80  Resp 16  MEWS COLOR  MEWS Score Color Green  Oxygen Therapy  SpO2 98 %  O2 Device Room Air  MEWS Score  MEWS Temp 0  MEWS Systolic 0  MEWS Pulse 0  MEWS RR 0  MEWS LOC 0  MEWS Score 0   The patient is admitted from PACU to 3 E. Room 29. A & O x 4. Full assessment to epic completed. Will  continue to monitor.

## 2023-09-04 NOTE — H&P (Signed)
 Electrophysiology Office Note:     Date:  09/04/2023    ID:  Leslie [name], DOB September 20, 1955, MRN [id number]   [facility] Cardiologist:  Magnus Schuller, MD  [facility] Electrophysiologist:  Boyce Byes, MD    Referring MD: Ervin Heath, [place]    Chief Complaint: Chronic systolic heart failure   History of Present Illness:     Leslie [name] is a 68 year old woman who I am seeing today for an evaluation of chronic systolic heart failure and left bundle branch block at the request of Hao Meng, PA-C.  The patient was last seen May 19, 2023.  She has a history of hypertension, hyperlipidemia, diabetes and coronary artery disease.  She was admitted in October 2024 with an NSTEMI.  She was noted to have a reduced ejection fraction.  She received PCI to her mid LAD lesion.  She had a repeat echo in February of this year which showed an ejection fraction of 35% and significant LV dyssynchrony.   She is with her husband today in clinic.  No syncope or presyncope.  No chest pain.  Presents for CRTD implant.    Objective Their past medical, social and family history was reviewed.     ROS:   Please see the history of present illness.    All other systems reviewed and are negative.   EKGs/Labs/Other Studies Reviewed:     The following studies were reviewed today:   May 17, 2023 echo personally reviewed EF 35% Global hypokinesis Dyssynchrony consistent with left bundle right block   February 14, 2023 EKG shows sinus rhythm, left bundle branch block, QRS duration 154 ms.          Physical Exam:     VS:  BP 126/60   Pulse 73   Ht 5\' 3"  (1.6 m)   Wt 200 lb 12.8 oz (91.1 kg)   SpO2 97%   BMI 35.57 kg/m         Wt Readings from Last 3 Encounters:  07/13/23 200 lb 12.8 oz (91.1 kg)  05/19/23 203 lb (92.1 kg)  02/14/23 204 lb 9.6 oz (92.8 kg)      GEN: no distress CARD: RRR, No MRG RESP: No IWOB. CTAB.         Assessment ASSESSMENT AND PLAN:     1. LBBB (left  bundle branch block)   2. Ischemic cardiomyopathy   3. Chronic systolic heart failure (HCC)   4. CAD S/P percutaneous coronary angioplasty       #Chronic systolic heart failure NYHA class II-III.  Warm and dry on exam.  Last EF 35% although windows were technically difficult during study.  Given her history of coronary artery disease and left bundle branch block, I would like to get a cardiac MRI prior to implanting an ICD to help further risk stratify her and quantify LGE burden.  If EF is less than or equal to 35%, favor proceeding with CRT-D implant.  I did discuss the CRT implant procedure in detail with the patient during today's clinic appointment.  She is interested in proceeding if EF remains low on cardiac MRI.   Continue spironolactone , Entresto , metoprolol , Farxiga .   She will stay on aspirin  uninterrupted around the time of the ICD implant if we go that route.  If we proceed with ICD implant she will hold her ticagrelor  for 3 days.This has been discussed with Dr. Addie Holstein who performed her PCI in October 2024. Would wait until 6 mo mark after PCI  to hold Ticagrelor  (April 28).   -------------   The patient has an ischemic CM (EF 35%), NYHA Class III CHF, and CAD.  He is referred by Ervin Heath, PA-C for risk stratification of sudden death and consideration of ICD implantation.  At this time, she meets criteria for ICD implantation for primary prevention of sudden death.  I have had a thorough discussion with the patient reviewing options.  The patient and their family (if available) have had opportunities to ask questions and have them answered. The patient and I have decided together through a shared decision making process to proceed with ICD implant at this time.     Risks, benefits, alternatives to ICD implantation were discussed in detail with the patient today. The patient understands that the risks include but are not limited to bleeding, infection, pneumothorax, perforation,  tamponade, vascular damage, renal failure, MI, stroke, death, inappropriate shocks, and lead dislodgement and wishes to proceed.  We will therefore schedule device implantation at the next available time.   Plan for Medtronic CRT-D.   #Coronary artery disease No ischemic symptoms today Continue aspirin  and ticagrelor  as above.  If proceed with ICD, favor holding ticagrelor  for 3 days prior to ICD implant as long as we are at the 24-month mark post PCI.   Presents for CRTD implant. Procedure reviewed.    Signed, Leanora Prophet. Marven Slimmer, MD, [facility], [facility] 09/04/2023 Electrophysiology [facility]

## 2023-09-05 ENCOUNTER — Other Ambulatory Visit (HOSPITAL_COMMUNITY): Payer: Self-pay

## 2023-09-05 ENCOUNTER — Ambulatory Visit (HOSPITAL_COMMUNITY)

## 2023-09-05 ENCOUNTER — Encounter (HOSPITAL_COMMUNITY): Payer: Self-pay | Admitting: Cardiology

## 2023-09-05 DIAGNOSIS — I110 Hypertensive heart disease with heart failure: Secondary | ICD-10-CM | POA: Diagnosis not present

## 2023-09-05 DIAGNOSIS — I447 Left bundle-branch block, unspecified: Secondary | ICD-10-CM | POA: Diagnosis not present

## 2023-09-05 DIAGNOSIS — I2510 Atherosclerotic heart disease of native coronary artery without angina pectoris: Secondary | ICD-10-CM | POA: Diagnosis not present

## 2023-09-05 DIAGNOSIS — E119 Type 2 diabetes mellitus without complications: Secondary | ICD-10-CM | POA: Diagnosis not present

## 2023-09-05 DIAGNOSIS — I252 Old myocardial infarction: Secondary | ICD-10-CM | POA: Diagnosis not present

## 2023-09-05 DIAGNOSIS — Z7984 Long term (current) use of oral hypoglycemic drugs: Secondary | ICD-10-CM | POA: Diagnosis not present

## 2023-09-05 DIAGNOSIS — E785 Hyperlipidemia, unspecified: Secondary | ICD-10-CM | POA: Diagnosis not present

## 2023-09-05 DIAGNOSIS — Z7982 Long term (current) use of aspirin: Secondary | ICD-10-CM | POA: Diagnosis not present

## 2023-09-05 DIAGNOSIS — Z79899 Other long term (current) drug therapy: Secondary | ICD-10-CM | POA: Diagnosis not present

## 2023-09-05 DIAGNOSIS — Z955 Presence of coronary angioplasty implant and graft: Secondary | ICD-10-CM | POA: Diagnosis not present

## 2023-09-05 DIAGNOSIS — I255 Ischemic cardiomyopathy: Secondary | ICD-10-CM | POA: Diagnosis not present

## 2023-09-05 DIAGNOSIS — I5022 Chronic systolic (congestive) heart failure: Secondary | ICD-10-CM | POA: Diagnosis not present

## 2023-09-05 DIAGNOSIS — Z95810 Presence of automatic (implantable) cardiac defibrillator: Secondary | ICD-10-CM | POA: Diagnosis not present

## 2023-09-05 MED ORDER — ORAL CARE MOUTH RINSE: 15.0000 mL | OROMUCOSAL | Status: DC | PRN

## 2023-09-05 MED ORDER — OXYCODONE-ACETAMINOPHEN 5-325 MG PO TABS
1.0000 | ORAL_TABLET | ORAL | Status: AC | PRN
  Administered 2023-09-05 (×2): 1 via ORAL
  Filled 2023-09-05 (×2): qty 1

## 2023-09-05 MED ORDER — OXYCODONE HCL 5 MG PO TABS
5.0000 mg | ORAL_TABLET | Freq: Three times a day (TID) | ORAL | 0 refills | Status: AC | PRN
  Filled 2023-09-05: qty 6, 2d supply, fill #0

## 2023-09-05 NOTE — Discharge Instructions (Signed)
 After Your ICD (Implantable Cardiac Defibrillator)   You have a Medtronic ICD  ACTIVITY Do not lift your arm above shoulder height for 1 week after your procedure. After 7 days, you may progress as below.  You should remove your sling 24 hours after your procedure, unless otherwise instructed by your provider.     Tuesday September 12, 2023  Wednesday September 13, 2023 Thursday September 14, 2023 Friday September 15, 2023   Do not lift, push, pull, or carry anything over 10 pounds with the affected arm until 6 weeks (Tuesday October 17, 2023 ) after your procedure.   You may drive AFTER your wound check, unless you have been told otherwise by your provider.   Ask your healthcare provider when you can go back to work   INCISION/Dressing If you are on a blood thinner such as Coumadin, Xarelto, Eliquis, Plavix, or Pradaxa please confirm with your provider when this should be resumed. Resume Brilinta  (Ticagrelor ) on 09/08/23  If large square, outer bandage is left in place, this can be removed after 24 hours from your procedure. Do not remove steri-strips or glue as below.   Monitor your defibrillator site for redness, swelling, and drainage. Call the device clinic at [phone] if you experience these symptoms or fever/chills.  If your incision is sealed with Steri-strips or staples, you may shower 7 days after your procedure or when told by your provider. Do not remove the steri-strips or let the shower hit directly on your site. You may wash around your site with soap and water.    If you were discharged in a sling, please do not wear this during the day more than 48 hours after your surgery unless otherwise instructed. This may increase the risk of stiffness and soreness in your shoulder.   Avoid lotions, ointments, or perfumes over your incision until it is well-healed.  You may use a hot tub or a pool AFTER your wound check appointment if the incision is completely closed.  Your ICD is designed to  protect you from life threatening heart rhythms. Because of this, you may receive a shock.   1 shock with no symptoms:  Call the office during business hours. 1 shock with symptoms (chest pain, chest pressure, dizziness, lightheadedness, shortness of breath, overall feeling unwell):  Call 911. If you experience 2 or more shocks in 24 hours:  Call 911. If you receive a shock, you should not drive for 6 months per the [place] DMV IF you receive appropriate therapy from your ICD.   ICD Alerts:  Some alerts are vibratory and others beep. These are NOT emergencies. Please call our office to let us  know. If this occurs at night or on weekends, it can wait until the next business day. Send a remote transmission.  If your device is capable of reading fluid status (for heart failure), you will be offered monthly monitoring to review this with you.   DEVICE MANAGEMENT Remote monitoring is used to monitor your ICD from home. This monitoring is scheduled every 91 days by our office. It allows us  to keep an eye on the functioning of your device to ensure it is working properly. You will routinely see your Electrophysiologist annually (more often if necessary).   You should receive your ID card for your new device in 4-8 weeks. Keep this card with you at all times once received. Consider wearing a medical alert bracelet or necklace.  Your ICD  may be MRI compatible. This will be  discussed at your next office visit/wound check.  You should avoid contact with strong electric or magnetic fields.   Do not use amateur (ham) radio equipment or electric (arc) welding torches. MP3 player headphones with magnets should not be used. Some devices are safe to use if held at least 12 inches (30 cm) from your defibrillator. These include power tools, lawn mowers, and speakers. If you are unsure if something is safe to use, ask your health care provider.  When using your cell phone, hold it to the ear that is on the opposite side  from the defibrillator. Do not leave your cell phone in a pocket over the defibrillator.  You may safely use electric blankets, heating pads, computers, and microwave ovens.  Call the office right away if: You have chest pain. You feel more than one shock. You feel more short of breath than you have felt before. You feel more light-headed than you have felt before. Your incision starts to open up.  This information is not intended to replace advice given to you by your health care provider. Make sure you discuss any questions you have with your health care provider.

## 2023-09-05 NOTE — Discharge Summary (Addendum)
 ELECTROPHYSIOLOGY PROCEDURE DISCHARGE SUMMARY    Patient ID: Leslie [name],  MRN: [id number], DOB/AGE: 68-Jul-1957 68 y.o.  Admit date: 09/04/2023 Discharge date: 09/05/2023  Primary Care Physician: Orlena Bitters, MD  Primary Cardiologist: Dr. Loetta Ringer Electrophysiologist: Dr. Marven Slimmer  Primary Discharge Diagnosis:  ICM Chronic CHF (systolic) LBBB  Secondary Discharge Diagnosis:  CAD DM HTN  Allergies  Allergen Reactions   Hydrochlorothiazide Other (See Comments)    Caused High sugars   Morphine  And Codeine Nausea And Vomiting   Tape Other (See Comments)    And bandaids also, causes skin rawness and tears   Lisinopril Cough     Procedures This Admission:  1.  Implantation of a CRT-D on 09/04/23 by Dr Marven Slimmer.   DFT's were deferred at time of implant CONCLUSIONS:  1. Successful implantation of a CRT-D for primary prevention of sudden cardiac death 2. No early apparent complications.  3.  Continue aspirin  81 mg by mouth once daily 4.  Hold ticagrelor  for 3 days (okay to restart September 08, 2023) 2.  CXR on 09/05/23 demonstrated no pneumothorax status post device implantation.    NOTE: no acceptable CS branch/measurements found > LB area RV pacing lead placed with excellent QRS morphology   Brief HPI: Leslie [name] is a 68 y.o. female was referred to electrophysiology in the outpatient setting for consideration of ICD implantation.  Past medical history includes above.  The patient has persistent LV dysfunction despite guideline directed therapy.  Risks, benefits, and alternatives to ICD implantation were reviewed with the patient who wished to proceed.   Hospital Course:  The patient was admitted and underwent implantation of a CRT-D with details as outlined in the procedure report. She was monitored on telemetry overnight which demonstrated SR/V paced rhythm.  Left chest was without hematoma or ecchymosis.  The device was interrogated and found to be functioning normally.   CXR was obtained and demonstrated no pneumothorax status post device implantation.  Wound care, arm mobility, and restrictions were reviewed with the patient.  The patient feels well, denies any CP or SOB, moderate site discomfort, requesting pain medication for home, Dr. Marven Slimmer advised Tylenol , though approved short course of oxycodone (took here and tolerated) with mild nausea.  She was examined by Dr. Marven Slimmer and considered stable for discharge to home.   The patient's discharge medications include an ACE/ARB (Entresto ) and beta blocker (Metoprolol ).   PDMP is reviewed, no active or recent prescriptions for controlled substances Education on narcotic safety completed Oxycodone IR tab 5mg  Q8 prn severe pain,  #6 NR  Physical Exam: Vitals:   09/05/23 0232 09/05/23 0342 09/05/23 0500 09/05/23 0714  BP:  (!) 99/51  132/72  Pulse: 71 73 72 77  Resp: 15 18 14 12   Temp:  97.7 F (36.5 C)  97.7 F (36.5 C)  TempSrc:  Oral  Oral  SpO2: 94% 94% 95% 95%  Weight:  90.1 kg    Height:        GEN- The patient is well appearing, alert and oriented x 3 today.   HEENT: normocephalic, atraumatic; sclera clear, conjunctiva pink; hearing intact; oropharynx clear Lungs-  CTA b/l, normal work of breathing.  No wheezes, rales, rhonchi Heart-  RRR, no murmurs, rubs or gallops, PMI not laterally displaced GI- soft, non-tender, non-distended Extremities- no clubbing, cyanosis, or edema MS- no significant deformity or atrophy Skin- warm and dry, no rash or lesion, left chest without hematoma/ecchymosis Psych- euthymic mood, full affect Neuro- no  gross defecits  Labs:   Lab Results  Component Value Date   WBC 16.3 (H) 08/15/2023   HGB 12.8 08/15/2023   HCT 40.2 08/15/2023   MCV 92 08/15/2023   PLT 402 08/15/2023   No results for input(s): "NA", "K", "CL", "CO2", "BUN", "CREATININE", "CALCIUM ", "PROT", "BILITOT", "ALKPHOS", "ALT", "AST", "GLUCOSE" in the last 168 hours.  Invalid input(s):  "LABALBU"  Discharge Medications:  Allergies as of 09/05/2023       Reactions   Hydrochlorothiazide Other (See Comments)   Caused High sugars   Morphine  And Codeine Nausea And Vomiting   Tape Other (See Comments)   And bandaids also, causes skin rawness and tears   Lisinopril Cough        Medication List     STOP taking these medications    naproxen sodium 220 MG tablet Commonly known as: ALEVE       TAKE these medications    albuterol  108 (90 Base) MCG/ACT inhaler Commonly known as: VENTOLIN  HFA Inhale 1-2 puffs into the lungs every 6 (six) hours as needed for shortness of breath or wheezing.   aspirin  EC 81 MG tablet Take 81 mg by mouth daily.   atorvastatin  40 MG tablet Commonly known as: LIPITOR Take 1 tablet (40 mg total) by mouth daily. What changed: when to take this   cetirizine 10 MG tablet Commonly known as: ZYRTEC Take 10 mg by mouth at bedtime.   dapagliflozin  propanediol 10 MG Tabs tablet Commonly known as: FARXIGA  Take 1 tablet (10 mg total) by mouth daily. What changed: when to take this   Entresto  49-51 MG Generic drug: sacubitril-valsartan Take 1 tablet by mouth 2 (two) times daily.   ferrous gluconate 324 MG tablet Commonly known as: FERGON Take 324 mg by mouth 2 (two) times daily with a meal.   glipiZIDE 10 MG tablet Commonly known as: GLUCOTROL Take 5-10 mg by mouth See admin instructions. Take 10 mg in the morning and 5 mg in the evening   metFORMIN 500 MG tablet Commonly known as: GLUCOPHAGE Take 500-1,000 mg by mouth See admin instructions. Take 2 tablets in AM and 1 tablet in PM   metoprolol  succinate 100 MG 24 hr tablet Commonly known as: TOPROL -XL Take 1 tablet (100 mg total) by mouth daily. Take with or immediately following a meal.   nitroGLYCERIN  0.4 MG SL tablet Commonly known as: Nitrostat  Place 1 tablet (0.4 mg total) under the tongue every 5 (five) minutes as needed for chest pain.   OneTouch UltraSoft 2 Lancets  Misc USE EVERY DAY TO test BLOOD SUGAR   OneTouch Verio Flex System w/Device Kit every other day.   OneTouch Verio test strip Generic drug: glucose blood every other day.   oxyCODONE 5 MG immediate release tablet Commonly known as: Oxy IR/ROXICODONE Take 1 tablet (5 mg total) by mouth every 8 (eight) hours as needed for up to 2 days (severe pain).   pantoprazole  40 MG tablet Commonly known as: PROTONIX  Take 40 mg by mouth at bedtime.   ticagrelor  90 MG Tabs tablet Commonly known as: BRILINTA  Take 1 tablet (90 mg total) by mouth 2 (two) times daily. Notes to patient: Do not resume until 09/08/23        Disposition: Home Discharge Instructions     Diet - low sodium heart healthy   Complete by: As directed    Increase activity slowly   Complete by: As directed        Follow-up Information  Orlena Bitters, MD. Caleen Catalan on 09/12/2023.   Specialty: Internal Medicine Why: @12n  Contact information: [address] [place] [address] 336 [phone]                 Duration of Discharge Encounter: 15 minutes, APP time.  [place], PA-C 09/05/2023 11:01 AM    \

## 2023-09-05 NOTE — Plan of Care (Signed)
  Problem: Education: Goal: Knowledge of cardiac device and self-care will improve Outcome: Progressing Goal: Ability to safely manage health related needs after discharge will improve Outcome: Progressing   Problem: Education: Goal: Knowledge of General Education information will improve Description: Including pain rating scale, medication(s)/side effects and non-pharmacologic comfort measures Outcome: Progressing   Problem: Clinical Measurements: Goal: Will remain free from infection Outcome: Progressing Goal: Respiratory complications will improve Outcome: Progressing   Problem: Activity: Goal: Risk for activity intolerance will decrease Outcome: Progressing

## 2023-09-05 NOTE — Progress Notes (Signed)
 The patient is complaining of incision pain of 7/10 on a pain scale. Notified Dr. Sanjuana Crutch and received order for PRN Percocet. Will administer and continue to monitor.

## 2023-09-06 ENCOUNTER — Encounter: Payer: Self-pay | Admitting: Emergency Medicine

## 2023-09-06 MED FILL — Lidocaine HCl Local Inj 1%: INTRAMUSCULAR | Qty: 40 | Status: AC

## 2023-09-11 ENCOUNTER — Encounter: Payer: Self-pay | Admitting: Cardiovascular Disease

## 2023-09-11 ENCOUNTER — Ambulatory Visit: Payer: Medicare HMO | Attending: Cardiovascular Disease | Admitting: Cardiovascular Disease

## 2023-09-11 DIAGNOSIS — I10 Essential (primary) hypertension: Secondary | ICD-10-CM

## 2023-09-11 DIAGNOSIS — E669 Obesity, unspecified: Secondary | ICD-10-CM

## 2023-09-11 DIAGNOSIS — I2510 Atherosclerotic heart disease of native coronary artery without angina pectoris: Secondary | ICD-10-CM | POA: Diagnosis not present

## 2023-09-11 DIAGNOSIS — I447 Left bundle-branch block, unspecified: Secondary | ICD-10-CM

## 2023-09-11 DIAGNOSIS — Z9861 Coronary angioplasty status: Secondary | ICD-10-CM

## 2023-09-11 DIAGNOSIS — I5022 Chronic systolic (congestive) heart failure: Secondary | ICD-10-CM

## 2023-09-11 DIAGNOSIS — I428 Other cardiomyopathies: Secondary | ICD-10-CM

## 2023-09-11 DIAGNOSIS — E785 Hyperlipidemia, unspecified: Secondary | ICD-10-CM | POA: Diagnosis not present

## 2023-09-11 NOTE — Patient Instructions (Signed)
 Medication Instructions:  Your physician recommends that you continue on your current medications as directed. Please refer to the Current Medication list given to you today.  *If you need a refill on your cardiac medications before your next appointment, please call your pharmacy*  Lab Work: None ordered  If you have labs (blood work) drawn today and your tests are completely normal, you will receive your results only by: MyChart Message (if you have MyChart) OR A paper copy in the mail If you have any lab test that is abnormal or we need to change your treatment, we will call you to review the results.  Testing/Procedures: None ordered  Follow-Up: At [facility], you and your health needs are our priority.  As part of our continuing mission to provide you with exceptional heart care, our providers are all part of one team.  This team includes your primary Cardiologist (physician) and Advanced Practice Providers or APPs (Physician Assistants and Nurse Practitioners) who all work together to provide you with the care you need, when you need it.  Your next appointment:   5 month(s)  Provider:   Randene Bustard, MD    We recommend signing up for the patient portal called "MyChart".  Sign up information is provided on this After Visit Summary.  MyChart is used to connect with patients for Virtual Visits (Telemedicine).  Patients are able to view lab/test results, encounter notes, upcoming appointments, etc.  Non-urgent messages can be sent to your provider as well.   To learn more about what you can do with MyChart, go to [email or web address].   Other Instructions

## 2023-09-11 NOTE — Progress Notes (Signed)
 Cardiology Office Note    Date:  09/11/2023   ID:  Leslie [name], DOB [birth year], MRN [id number]  PCP:  Orlena Bitters, MD  Cardiologist:  Magnus Schuller, MD   Initial office visit with me following her October 2024 hospitalization.   History of Present Illness:  Leslie [name] is a 68 y.o. female who has a history of mild asthma, type 2 diabetes mellitus, hypertension, hyperlipidemia, and obesity.  She presented to [facility] on October 25 with chest pain rating to her arms back and jaw.  ECG showed sinus rhythm with new left bundle branch block.  High-sensitivity troponin was mildly elevated at 140.  He subsequently underwent cardiac catheterization by Dr. Randene Bustard and was found to have 80% stenosis of the mid LAD with 70% stenosis of the sidebranch second diagonal vessel.  She underwent successful PCI DES stenting to the LAD with insertion of a 2.25 x 24 mm Synergy stent and PTCA of the diagonal sidebranch.  An echo Doppler study showed EF 35 to 40% with global hypokinesis and significant LV dyssynchrony secondary to her left bundle branch block.   I have not seen her since her hospitalization.  Subsequently, she was evaluated by Ervin Heath, PA on May 19, 2023.  At the time she was on medical regimen consisting of metoprolol  succinate, Entresto , spironolactone , and Farxiga .  She subsequently was referred for a follow-up echo Doppler study on May 17, 2023 which showed EF at 35% with global hypokinesis.  She was referred to Dr. Harvie Liner for EP evaluation and underwent cardiac MRI which showed moderate decrease in LV function with EF 33% with prominent septal dyssynchrony greening secondary to her left bundle branch block.  There was no late gadolinium enhancement in the left ventricular myocardium.  She had normal RV systolic function with EF 55%.  In September 04, 2023, she underwent successful BiV ICD insertion CRT-D by Dr. Marven Slimmer and tolerated procedure well.  She is  scheduled for a follow-up EP wound check next week.  She presents for cardiology evaluation to see me in follow-up of her initial presentation and CAD.  Presently, Leslie [name] feels well.  She denies chest pain or shortness of breath.  She is on a medical regimen of aspirin  81 mg and Brilinta  90 mg twice a day.  She is on guideline directed medical therapy with Farxiga  10 mg, Entresto  49/51 mg twice a day, metoprolol  succinate 100 mg daily.  She is diabetic on glipizide and metformin.  She takes pantoprazole  for GERD.  She is on atorvastatin  40 mg for lipid management.  LDL cholesterol in October 2024 was excellent at 42 with total cholesterol 123.  Triglycerides were mildly elevated at 190.  She presents for evaluation.   Past Medical History:  Diagnosis Date   Arthritis    Asthma    Cancer (HCC)    skin cancer   Diabetes mellitus without complication (HCC)    Type 2 NIDDM x 3 years   GERD (gastroesophageal reflux disease)    Hernia, ventral    History of kidney stones    Hyperlipidemia    Hypertension    on meds for 2 years   PONV (postoperative nausea and vomiting)     Past Surgical History:  Procedure Laterality Date   ABDOMINAL HYSTERECTOMY  2001   BIV ICD INSERTION CRT-D N/A 09/04/2023   Procedure: BIV ICD INSERTION CRT-D;  Surgeon: Boyce Byes, MD;  Location: [facility] INVASIVE CV LAB;  Service: Cardiovascular;  Laterality: N/A;   BREAST BIOPSY Left    CATARACT EXTRACTION Left    CATARACT EXTRACTION W/PHACO Right 11/16/2015   Procedure: CATARACT EXTRACTION PHACO AND INTRAOCULAR LENS PLACEMENT RIGHT EYE; CDE:  8.65;  Surgeon: Anner Kill, MD;  Location: [facility] ORS;  Service: Ophthalmology;  Laterality: Right;   CHOLECYSTECTOMY  1991   CORONARY STENT INTERVENTION N/A 01/30/2023   Procedure: CORONARY STENT INTERVENTION;  Surgeon: Arleen Lacer, MD;  Location: [facility] INVASIVE CV LAB;  Service: Cardiovascular;  Laterality: N/A;   HERNIA REPAIR  2002   inscisional hernia x4   INCISION AND  DRAINAGE ABSCESS N/A 07/02/2012   Procedure: INCISION AND DRAINAGE ABDOMINAL WALL ABSCESS;  Surgeon: Diantha Fossa, MD;  Location: [facility] OR;  Service: General;  Laterality: N/A;   kidney stones  2000   cystoscopy with stone removal   LEAD INSERTION N/A 09/04/2023   Procedure: LEAD INSERTION;  Surgeon: Boyce Byes, MD;  Location: [facility] INVASIVE CV LAB;  Service: Cardiovascular;  Laterality: N/A;   LEFT HEART CATH AND CORONARY ANGIOGRAPHY N/A 01/30/2023   Procedure: LEFT HEART CATH AND CORONARY ANGIOGRAPHY;  Surgeon: Arleen Lacer, MD;  Location: [facility] INVASIVE CV LAB;  Service: Cardiovascular;  Laterality: N/A;   TONSILLECTOMY  1967    Current Medications: Outpatient Medications Prior to Visit  Medication Sig Dispense Refill   albuterol  (VENTOLIN  HFA) 108 (90 Base) MCG/ACT inhaler Inhale 1-2 puffs into the lungs every 6 (six) hours as needed for shortness of breath or wheezing.     aspirin  EC 81 MG tablet Take 81 mg by mouth daily.     Blood Glucose Monitoring Suppl (ONETOUCH VERIO FLEX SYSTEM) w/Device KIT every other day.     cetirizine (ZYRTEC) 10 MG tablet Take 10 mg by mouth at bedtime.     dapagliflozin  propanediol (FARXIGA ) 10 MG TABS tablet Take 1 tablet (10 mg total) by mouth daily. (Patient taking differently: Take 10 mg by mouth at bedtime.) 90 tablet 3   ferrous gluconate (FERGON) 324 MG tablet Take 324 mg by mouth 2 (two) times daily with a meal.     glipiZIDE (GLUCOTROL) 10 MG tablet Take 5-10 mg by mouth See admin instructions. Take 10 mg in the morning and 5 mg in the evening     metFORMIN (GLUCOPHAGE) 500 MG tablet Take 500-1,000 mg by mouth See admin instructions. Take 2 tablets in AM and 1 tablet in PM     nitroGLYCERIN  (NITROSTAT ) 0.4 MG SL tablet Place 1 tablet (0.4 mg total) under the tongue every 5 (five) minutes as needed for chest pain. 25 tablet 2   OneTouch UltraSoft 2 Lancets MISC USE EVERY DAY TO test BLOOD SUGAR     ONETOUCH VERIO test strip every other day.      pantoprazole  (PROTONIX ) 40 MG tablet Take 40 mg by mouth at bedtime.     sacubitril -valsartan  (ENTRESTO ) 49-51 MG Take 1 tablet by mouth 2 (two) times daily. 180 tablet 3   ticagrelor  (BRILINTA ) 90 MG TABS tablet Take 1 tablet (90 mg total) by mouth 2 (two) times daily. 60 tablet 11   atorvastatin  (LIPITOR) 40 MG tablet Take 1 tablet (40 mg total) by mouth daily. (Patient taking differently: Take 40 mg by mouth at bedtime.) 90 tablet 3   metoprolol  succinate (TOPROL -XL) 100 MG 24 hr tablet Take 1 tablet (100 mg total) by mouth daily. Take with or immediately following a meal. 90 tablet 3   No facility-administered medications prior to visit.  Allergies:   Hydrochlorothiazide, Morphine  and codeine, Tape, and Lisinopril   Social History   Socioeconomic History   Marital status: Married    Spouse name: Richard   Number of children: 1   Years of education: Not on file   Highest education level: High school graduate  Occupational History   Occupation: Retired  Tobacco Use   Smoking status: Former    Current packs/day: 0.00    Average packs/day: 1 pack/day for 4.0 years (4.0 ttl pk-yrs)    Types: Cigarettes    Start date: 11/12/1963    Quit date: 11/12/1967    Years since quitting: 55.8   Smokeless tobacco: Never   Tobacco comments:    quit smoking 36 years ago  Vaping Use   Vaping status: Never Used  Substance and Sexual Activity   Alcohol  use: Yes    Comment: rare; 3 x year   Drug use: No   Sexual activity: Yes    Birth control/protection: Surgical  Other Topics Concern   Not on file  Social History Narrative   Not on file   Social Drivers of Health   Financial Resource Strain: Low Risk  (01/30/2023)   Overall Financial Resource Strain (CARDIA)    Difficulty of Paying Living Expenses: Not hard at all  Food Insecurity: Unknown (09/04/2023)   Hunger Vital Sign    Worried About Running Out of Food in the Last Year: Never true    Ran Out of Food in the Last Year: Patient  declined  Transportation Needs: No Transportation Needs (09/04/2023)   PRAPARE - Administrator, Civil Service (Medical): No    Lack of Transportation (Non-Medical): No  Physical Activity: Not on file  Stress: Not on file  Social Connections: Moderately Integrated (09/04/2023)   Social Connection and Isolation Panel [NHANES]    Frequency of Communication with Friends and Family: More than three times a week    Frequency of Social Gatherings with Friends and Family: Once a week    Attends Religious Services: Never    Database administrator or Organizations: No    Attends Engineer, structural: 1 to 4 times per year    Marital Status: Married     Family History:  The patient's family history includes Alzheimer's disease in her mother; Hypertension in her mother.   ROS General: Negative; No fevers, chills, or night sweats;  HEENT: Negative; No changes in vision or hearing, sinus congestion, difficulty swallowing Pulmonary: Negative; No cough, wheezing, shortness of breath, hemoptysis Cardiovascular: See HPI GI: Negative; No nausea, vomiting, diarrhea, or abdominal pain GU: Negative; No dysuria, hematuria, or difficulty voiding Musculoskeletal: Negative; no myalgias, joint pain, or weakness Hematologic/Oncology: Negative; no easy bruising, bleeding Endocrine: Negative; no heat/cold intolerance; no diabetes Neuro: Negative; no changes in balance, headaches Skin: Negative; No rashes or skin lesions Psychiatric: Negative; No behavioral problems, depression Sleep: Negative; No snoring, daytime sleepiness, hypersomnolence, bruxism, restless legs, hypnogognic hallucinations, no cataplexy Other comprehensive 14 point system review is negative.   PHYSICAL EXAM:   VS:  BP 122/76   Pulse 80   Ht 5' 2.5" (1.588 m)   Wt 196 lb 12.8 oz (89.3 kg)   SpO2 99%   BMI 35.42 kg/m     Repeat blood pressure by me was 130/72.  Wt Readings from Last 3 Encounters:  09/11/23 196 lb  12.8 oz (89.3 kg)  09/05/23 198 lb 9.6 oz (90.1 kg)  07/13/23 200 lb 12.8 oz (91.1 kg)  General: Alert, oriented, no distress.  Skin: normal turgor, no rashes, warm and dry HEENT: Normocephalic, atraumatic. Pupils equal round and reactive to light; sclera anicteric; extraocular muscles intact;  Nose without nasal septal hypertrophy Mouth/Parynx benign; Mallinpatti scale 3 Neck: No JVD, no carotid bruits; normal carotid upstroke Lungs: clear to ausculatation and percussion; no wheezing or rales Chest wall: BiV ICD CRT below left clavicle.  No significant ecchymosis or edema. Heart: PMI not displaced, RRR, s1 s2 normal, 1-2 okay at time now I know it will need to see how he was doing I just jokingly say I sweet and I will need to assist no yeah I am still 100 mg a month proposed is to keep dictating out of breath.  I think like it said they could 20 set up again and get it in the other days with I may keep coming here/6 systolic murmur, no diastolic murmur, no rubs, gallops, thrills, or heaves Abdomen: soft, nontender; no hepatosplenomehaly, BS+; abdominal aorta nontender and not dilated by palpation. Back: no CVA tenderness Pulses 2+ Musculoskeletal: full range of motion, normal strength, no joint deformities Extremities: no clubbing cyanosis or edema, Homan's sign negative  Neurologic: grossly nonfocal; Cranial nerves grossly wnl Psychologic: Normal mood and affect   Studies/Labs Reviewed:   EKG Interpretation Date/Time:  Monday September 11 2023 13:47:47 EDT Ventricular Rate:  80 PR Interval:  142 QRS Duration:  134 QT Interval:  384 QTC Calculation: 442 R Axis:   -7  Text Interpretation: Atrial-sensed ventricular-paced rhythm When compared with ECG of 05-Sep-2023 05:43, Vent. rate has increased BY   4 BPM Confirmed by Magnus Schuller ([id number]) on 09/11/2023 2:24:12 PM    Recent Labs:    Latest Ref Rng & Units 08/18/2023    2:49 PM 08/15/2023   12:24 PM 06/16/2023    2:47 PM  BMP   Glucose 70 - 99 mg/dL 604  540  981   BUN 8 - 27 mg/dL 19  19  13    Creatinine 0.57 - 1.00 mg/dL 1.91  4.78  2.95   BUN/Creat Ratio 12 - 28 22  21  15    Sodium 134 - 144 mmol/L 139  140  138   Potassium 3.5 - 5.2 mmol/L 5.2  5.8  4.6   Chloride 96 - 106 mmol/L 103  103  104   CO2 20 - 29 mmol/L 20  19  15    Calcium  8.7 - 10.3 mg/dL 62.1  30.8  9.2         Latest Ref Rng & Units 11/12/2014    3:55 PM 06/11/2012    3:37 PM  Hepatic Function  Total Protein 6.5 - 8.1 g/dL 7.2  7.9   Albumin 3.5 - 5.0 g/dL 3.7  3.8   AST 15 - 41 U/L 29  17   ALT 14 - 54 U/L 23  17   Alk Phosphatase 38 - 126 U/L 77  89   Total Bilirubin 0.3 - 1.2 mg/dL 0.3  0.2        Latest Ref Rng & Units 08/15/2023   12:23 PM 01/31/2023    4:49 AM 01/30/2023    7:11 AM  CBC  WBC 3.4 - 10.8 x10E3/uL 16.3  10.2  10.3   Hemoglobin 11.1 - 15.9 g/dL 65.7  84.6  96.2   Hematocrit 34.0 - 46.6 % 40.2  35.3  34.6   Platelets 150 - 450 x10E3/uL 402  284  281    Lab Results  Component  Value Date   MCV 92 08/15/2023   MCV 85.1 01/31/2023   MCV 85.2 01/30/2023   No results found for: "TSH" Lab Results  Component Value Date   HGBA1C 6.9 (H) 01/28/2023     BNP No results found for: "BNP"  ProBNP No results found for: "PROBNP"   Lipid Panel     Component Value Date/Time   CHOL 123 01/29/2023 0513   TRIG 190 (H) 01/29/2023 0513   HDL 43 01/29/2023 0513   CHOLHDL 2.9 01/29/2023 0513   VLDL 38 01/29/2023 0513   LDLCALC 42 01/29/2023 0513     RADIOLOGY: DG Chest 2 View Result Date: 09/05/2023 CLINICAL DATA:  ICD implantable cardioverter defibrillator placement EXAM: CHEST - 2 VIEW COMPARISON:  January 27, 2023 FINDINGS: The heart size and mediastinal contours are within normal limits. Both lungs are clear. The visualized skeletal structures are unremarkable. New left subclavian bipolar ICDF pacemaker device tip of the leads in good position no evidence of discontinuity IMPRESSION: Pacemaker as described.  No acute cardiopulmonary disease or pneumothorax Electronically Signed   By: Fredrich Jefferson M.D.   On: 09/05/2023 08:34   EP PPM/ICD IMPLANT Result Date: 09/04/2023 CONCLUSIONS: 1. Successful implantation of a CRT-D for primary prevention of sudden cardiac death 2. No early apparent complications. 3.  Continue aspirin  81 mg by mouth once daily 4.  Hold ticagrelor  for 3 days (okay to restart September 08, 2023)     Additional studies/ records that were reviewed today include:     Mid LAD lesion is 80% stenosed with 70% stenosed side branch in 2nd Diag.   Balloon angioplasty was performed on the 2nd Diag ostium, using a BALLOON TAKERU 1.5X12.  Post intervention, the side branch was reduced to 55% residual stenosis.   A drug-eluting stent was successfully placed using a SYNERGY XD 2.25X2 -> deployed to 2.4 mm, postdilated proximally to 2.6 mm. Post intervention, there is a 0% residual stenosis.   --------------------------------------   Otherwise minimal CAD in the remainder of the coronary system.   --------------------------------------   LV end diastolic pressure is mildly elevated.   There is no aortic valve stenosis.   POST-CATH DIAGNOSES Single-vessel CAD with extensive LAD calcification and a segmental 70-85% stenosis in the mid LAD beginning at and involving ostium of small caliber 2nd Diag. Successful bifurcation PCI of the LAD and 2nd Diag using a Synergy XD DES 2.25 mm x 24 mm deployed to 2.4 mm and postdilated proximally to 2.6 mm-reducing the lesion segment to 0% PTCA of ostial 2nd Diag sidebranch with 1.0 mm balloon to maintain flow Otherwise large caliber codominant LCx that terminates as a large posterolateral OM branch and large caliber RCA that terminates as a posterior descending artery with a small PL branch. Reduced EF by echo, mildly increased LVEDP.      RECOMMENDATIONS In the absence of any other complications or medical issues, we expect the patient to be ready for discharge  from an interventional cardiology perspective on 01/30/2023. Recommend uninterrupted dual antiplatelet therapy with Aspirin  81mg  daily and Clopidogrel 75mg  daily for a minimum of 12 months (ACS-Class I recommendation).  Would convert to Brilinta  60 mg twice daily or Plavix 75 mg daily SAPT after 1 year. For urgent procedures, could potentially hold DAPT temporarily at 6 months. For significant bleeding or bruising, could stop aspirin  at 3-6 months if remains on Brilinta . I have held her ARB dose for today since she is borderline hypotensive in the Cath Lab.  Can consider  restarting tomorrow depending on blood pressure assessment prior to discharge. Otherwise continue to titrate CAD GDMT as tolerated.       Randene Bustard, MD    Intervention     BIV ICD INSERTION CRT-D  LEAD INSERTION: 09/04/2023   CONCLUSIONS:  1. Successful implantation of a CRT-D for primary prevention of sudden cardiac death 2. No early apparent complications.  3.  Continue aspirin  81 mg by mouth once daily 4.  Hold ticagrelor  for 3 days (okay to restart September 08, 2023)  ASSESSMENT:    1. CAD S/P percutaneous coronary angioplasty: DES stent to LAD, PTCA of second diagonal branch October 2024   2. Nonischemic cardiomyopathy (HCC)   3. Chronic systolic heart failure (HCC)   4. LBBB (left bundle branch block)   5. Essential hypertension   6. Hyperlipidemia LDL goal < 55   7. Moderate obesity     PLAN:  Leslie [name] is a 68 year old female who developed new onset chest discomfort leading to hospitalization in October 2024.  She has a history of hypertension, hyperlipidemia, type 2 diabetes mellitus and GERD.  She was found to have an 80% stenosis of the mid LAD with 70% stenosis of a sidebranch secondary to vessel and underwent successful intervention with stenting to the LAD and PTCA to the diagonal vessel done by Dr. Addie Holstein.  EF at that time was 35 to 40% with global hypokinesis and there was significant LV  dyssynchrony due to left bundle branch block.  EF persistently remained around 35% despite revascularization.  Cardiac MRI showed EF of 33% with concentric hypertrophy.  There was no late gadolinium enhancement in the left ventricular myocardium.  In light of persistent LV dysfunction despite guideline directed medical therapy, she underwent successful BiV ICD insertion with CRT-D by Dr. Marven Slimmer last week on September 04, 2023 for primary prevention of sudden cardiac death.  Her device site is stable on exam today.  Patient denies any recurrence of chest pain or significant shortness of breath.  EKG today shows atrially sensed ventricular paced rhythm at 80 bpm.  Blood pressure today is stable on her current regimen of Entresto  49/51 mg twice a day, Farxiga  10 mg, metoprolol  succinate 100 mg daily, and she continues to be on DAPT with aspirin /Brilinta  90 mg twice a day.  In November, she may be a candidate for reduction of Brilinta  dose to 60 mg twice daily.  She will be undergoing subsequent defibrillator device check with EP.  I discussed with her my plans for upcoming retirement.  I will transition her to the cardiology care of Dr. Addie Holstein who performed her intervention.  She will continue to be followed by Dr. Marven Slimmer for EP.   Medication Adjustments/Labs and Tests Ordered: Current medicines are reviewed at length with the patient today.  Concerns regarding medicines are outlined above.  Medication changes, Labs and Tests ordered today are listed in the Patient Instructions below. Patient Instructions  Medication Instructions:  Your physician recommends that you continue on your current medications as directed. Please refer to the Current Medication list given to you today.  *If you need a refill on your cardiac medications before your next appointment, please call your pharmacy*  Lab Work: None ordered  If you have labs (blood work) drawn today and your tests are completely normal, you will receive your  results only by: MyChart Message (if you have MyChart) OR A paper copy in the mail If you have any lab test that is abnormal or we  need to change your treatment, we will call you to review the results.  Testing/Procedures: None ordered  Follow-Up: At [facility], you and your health needs are our priority.  As part of our continuing mission to provide you with exceptional heart care, our providers are all part of one team.  This team includes your primary Cardiologist (physician) and Advanced Practice Providers or APPs (Physician Assistants and Nurse Practitioners) who all work together to provide you with the care you need, when you need it.  Your next appointment:   5 month(s)  Provider:   Randene Bustard, MD    We recommend signing up for the patient portal called "MyChart".  Sign up information is provided on this After Visit Summary.  MyChart is used to connect with patients for Virtual Visits (Telemedicine).  Patients are able to view lab/test results, encounter notes, upcoming appointments, etc.  Non-urgent messages can be sent to your provider as well.   To learn more about what you can do with MyChart, go to [email or web address].   Other Instructions        Signed, Magnus Schuller, MD  09/11/2023 3:23 PM    [facility] [address], [address], [place], [place]  [address] Phone: [phone]

## 2023-09-19 ENCOUNTER — Ambulatory Visit: Attending: Cardiology

## 2023-09-19 DIAGNOSIS — I519 Heart disease, unspecified: Secondary | ICD-10-CM

## 2023-09-19 NOTE — Progress Notes (Signed)
 Normal CRT-D chamber ICD wound check. Wound well healed. Presenting rhythm: AS/VS 83. Routine testing performed. Thresholds, sensing, and impedances consistent with implant measurements with 3.5V safety margin/auto capture until 3 month visit. No treated arrhythmias. Reviewed arm restrictions to continue for 6 weeks total post op. Reviewed shock plan.  Pt enrolled in remote follow-up.

## 2023-09-19 NOTE — Patient Instructions (Signed)

## 2023-10-06 ENCOUNTER — Other Ambulatory Visit: Payer: Self-pay | Admitting: Cardiology

## 2023-10-09 ENCOUNTER — Telehealth: Payer: Self-pay | Admitting: Cardiology

## 2023-10-09 NOTE — Telephone Encounter (Signed)
   Pre-operative Risk Assessment    Patient Name: Chrissie Dacquisto  DOB: 29-Feb-1956 MRN: [id number]   Date of last office visit:  Date of next office visit:   Request for Surgical Clearance    Procedure:  Possible Filling   Date of Surgery:  Clearance  Now                                Surgeon:  Dr Lajoyce, DDS Surgeon's Group or Practice Name:   Phone number:  [phone] Fax number:  [phone]   Type of Clearance Requested:   need antibiotic    Type of Anesthesia:  Lidocaine    Additional requests/questions:    Signed, Kyra CHRISTELLA Bull   10/09/2023, 3:13 PM

## 2023-10-09 NOTE — Telephone Encounter (Signed)
   Patient Name: Leslie [name]  DOB: 12/04/1955 MRN: [id number]  Primary Cardiologist: Alm Clay, MD  Chart reviewed as part of pre-operative protocol coverage.   Simple dental extractions (i.e. 1-2 teeth), cleanings, fillings, are considered low risk procedures per guidelines and generally do not require any specific cardiac clearance. It is also generally accepted that for simple extractions and dental cleanings, there is no need to interrupt blood thinner therapy.   SBE prophylaxis is not required for the patient from a cardiac standpoint.  I will route this recommendation to the requesting party via Epic fax function and remove from pre-op pool.  Please call with questions.  Damien JAYSON Braver, NP 10/09/2023, 3:21 PM

## 2023-10-20 ENCOUNTER — Ambulatory Visit

## 2023-10-20 DIAGNOSIS — I447 Left bundle-branch block, unspecified: Secondary | ICD-10-CM | POA: Diagnosis not present

## 2023-10-24 LAB — CUP PACEART REMOTE DEVICE CHECK
Battery Remaining Longevity: 118 mo
Battery Voltage: 3.01 V
Brady Statistic AP VP Percent: 0.02 %
Brady Statistic AP VS Percent: 0.01 %
Brady Statistic AS VP Percent: 99.92 %
Brady Statistic AS VS Percent: 0.05 %
Brady Statistic RA Percent Paced: 0.03 %
Brady Statistic RV Percent Paced: 99.94 %
Date Time Interrogation Session: 20250717234359
HighPow Impedance: 67 Ohm
Implantable Lead Connection Status: 753985
Implantable Lead Connection Status: 753985
Implantable Lead Connection Status: 753985
Implantable Lead Implant Date: 20250602
Implantable Lead Implant Date: 20250602
Implantable Lead Implant Date: 20250602
Implantable Lead Location: 753858
Implantable Lead Location: 753859
Implantable Lead Location: 753860
Implantable Lead Model: 3830
Implantable Lead Model: 5076
Implantable Lead Model: 6935
Implantable Pulse Generator Implant Date: 20250602
Lead Channel Impedance Value: 190 Ohm
Lead Channel Impedance Value: 380 Ohm
Lead Channel Impedance Value: 380 Ohm
Lead Channel Impedance Value: 475 Ohm
Lead Channel Impedance Value: 551 Ohm
Lead Channel Impedance Value: 589 Ohm
Lead Channel Pacing Threshold Amplitude: 0.5 V
Lead Channel Pacing Threshold Amplitude: 0.625 V
Lead Channel Pacing Threshold Amplitude: 0.625 V
Lead Channel Pacing Threshold Pulse Width: 0.4 ms
Lead Channel Pacing Threshold Pulse Width: 0.4 ms
Lead Channel Pacing Threshold Pulse Width: 0.4 ms
Lead Channel Sensing Intrinsic Amplitude: 1.3 mV
Lead Channel Sensing Intrinsic Amplitude: 15.5 mV
Lead Channel Setting Pacing Amplitude: 1 V
Lead Channel Setting Pacing Amplitude: 1.75 V
Lead Channel Setting Pacing Amplitude: 2 V
Lead Channel Setting Pacing Pulse Width: 0.4 ms
Lead Channel Setting Pacing Pulse Width: 0.4 ms
Lead Channel Setting Sensing Sensitivity: 0.3 mV
Zone Setting Status: 755011
Zone Setting Status: 755011
Zone Setting Status: 755011

## 2023-10-26 ENCOUNTER — Ambulatory Visit: Payer: Self-pay | Admitting: Cardiology

## 2023-11-20 ENCOUNTER — Encounter: Payer: Self-pay | Admitting: Cardiology

## 2023-12-04 NOTE — Progress Notes (Unsigned)
 Cardiology Office Note:  .   Date:  12/04/2023  ID:  Leslie [name], DOB October 19, 1955, MRN [id number] PCP: Rosamond Leta NOVAK, MD  [facility] Providers Cardiologist:  Alm Clay, MD Cardiology APP:  Jadine Aline BRAVO, PA-C  Electrophysiologist:  OLE ONEIDA HOLTS, MD {  History of Present Illness: .   Leslie [name] is a 68 y.o. female w/PMHx of  DM, HTN,  CAD (PCI/DES to LAD and balloon angioplasty of the second diagonal Oct 2024), ICM, chronic CHF LBBB CRT-D  Today's visit is scheduled as her 90 day post implant visit ROS:   She is doing well Denies any CP, palpitations or cardiac awareness No SOB, denies DOE No near syncope or syncope  She has pocket discomfort Feels sometimes is rubbing on her clavicle, or when laying on her R side feels like it is going to fall out While it feels markedly better then immediately post implant remains somewhat tender   Device information MDT  CRT-D implanted 09/04/23 No acceptable CS branches/measurements > LB area pacing lead placed  Primary prevention   Studies Reviewed: SABRA    EKG not done today  DEVICE interrogation done today and reviewed by myself Battery and lead measurements are good No arrhythmias BP >99% OptiVol looks good   08/08/23: c.MRI IMPRESSION: 1. Moderate decrease in left ventricular systolic function (LVEF =33%). There is prominent septal dyssynchrony suggestive of left bundle branch block. 2. There is no late gadolinium enhancement in the left ventricular myocardium. 3. Normal right ventricular systolic function (RVEF =55%).  LHC 10/24   Mid LAD lesion is 80% stenosed with 70% stenosed side branch in 2nd Diag.   Balloon angioplasty was performed on the 2nd Diag ostium, using a BALLOON TAKERU 1.5X12.  Post intervention, the side branch was reduced to 55% residual stenosis.   A drug-eluting stent was successfully placed using a SYNERGY XD 2.25X2 -> deployed to 2.4 mm, postdilated proximally to 2.6 mm. Post  intervention, there is a 0% residual stenosis.   --------------------------------------   Otherwise minimal CAD in the remainder of the coronary system.   --------------------------------------   LV end diastolic pressure is mildly elevated.   There is no aortic valve stenosis.   POST-CATH DIAGNOSES Single-vessel CAD with extensive LAD calcification and a segmental 70-85% stenosis in the mid LAD beginning at and involving ostium of small caliber 2nd Diag. Successful bifurcation PCI of the LAD and 2nd Diag using a Synergy XD DES 2.25 mm x 24 mm deployed to 2.4 mm and postdilated proximally to 2.6 mm-reducing the lesion segment to 0% PTCA of ostial 2nd Diag sidebranch with 1.0 mm balloon to maintain flow Otherwise large caliber codominant LCx that terminates as a large posterolateral OM branch and large caliber RCA that terminates as a posterior descending artery with a small PL branch. Reduced EF by echo, mildly increased LVEDP.    Risk Assessment/Calculations:    Physical Exam:   VS:  There were no vitals taken for this visit.   Wt Readings from Last 3 Encounters:  09/11/23 196 lb 12.8 oz (89.3 kg)  09/05/23 198 lb 9.6 oz (90.1 kg)  07/13/23 200 lb 12.8 oz (91.1 kg)    GEN: Well nourished, well developed in no acute distress NECK: No JVD; No carotid bruits CARDIAC: RRR, no murmurs, rubs, gallops RESPIRATORY:  CTA b/l without rales, wheezing or rhonchi  ABDOMEN: Soft, non-tender, non-distended EXTREMITIES: No edema; No deformity   ICD site: is stable, no thinning, fluctuation, tethering Some tenderness toward  lateral edge of the pocket, scar Appears very well healed with no signs of infection  ASSESSMENT AND PLAN: .    CRT-D (LV lead is in LB position) intact function no programming changes made Chronic outputs/auto threshold on  CAD ICM Chronic CHF No symptoms or exam findings of volume OL OptiVol looks good C/w Dr. Huston > defer timing of next echo to him, sees  him in Nov   Dispo: I'll see her back in a couple months ensure pocket/site tenderness continues to improve/resolves, sooner if needed.  Remotes as usual  Signed, Charlies Macario Arthur, PA-C

## 2023-12-05 ENCOUNTER — Ambulatory Visit: Attending: Physician Assistant | Admitting: Physician Assistant

## 2023-12-05 ENCOUNTER — Encounter: Payer: Self-pay | Admitting: Physician Assistant

## 2023-12-05 VITALS — BP 136/68 | HR 82 | Ht 63.0 in | Wt 201.0 lb

## 2023-12-05 DIAGNOSIS — I2510 Atherosclerotic heart disease of native coronary artery without angina pectoris: Secondary | ICD-10-CM

## 2023-12-05 DIAGNOSIS — I255 Ischemic cardiomyopathy: Secondary | ICD-10-CM

## 2023-12-05 DIAGNOSIS — Z95810 Presence of automatic (implantable) cardiac defibrillator: Secondary | ICD-10-CM | POA: Diagnosis not present

## 2023-12-05 DIAGNOSIS — I5043 Acute on chronic combined systolic (congestive) and diastolic (congestive) heart failure: Secondary | ICD-10-CM | POA: Diagnosis not present

## 2023-12-05 LAB — CUP PACEART INCLINIC DEVICE CHECK
Date Time Interrogation Session: 20250902123707
Implantable Lead Connection Status: 753985
Implantable Lead Connection Status: 753985
Implantable Lead Connection Status: 753985
Implantable Lead Implant Date: 20250602
Implantable Lead Implant Date: 20250602
Implantable Lead Implant Date: 20250602
Implantable Lead Location: 753858
Implantable Lead Location: 753859
Implantable Lead Location: 753860
Implantable Lead Model: 3830
Implantable Lead Model: 5076
Implantable Lead Model: 6935
Implantable Pulse Generator Implant Date: 20250602

## 2023-12-05 NOTE — Patient Instructions (Signed)
 Medication Instructions:   Your physician recommends that you continue on your current medications as directed. Please refer to the Current Medication list given to you today.   *If you need a refill on your cardiac medications before your next appointment, please call your pharmacy*  Lab Work: NONE ORDERED  TODAY    If you have labs (blood work) drawn today and your tests are completely normal, you will receive your results only by: MyChart Message (if you have MyChart) OR A paper copy in the mail If you have any lab test that is abnormal or we need to change your treatment, we will call you to review the results.  Testing/Procedures: NONE ORDERED  TODAY    Follow-Up: At [facility], you and your health needs are our priority.  As part of our continuing mission to provide you with exceptional heart care, our providers are all part of one team.  This team includes your primary Cardiologist (physician) and Advanced Practice Providers or APPs (Physician Assistants and Nurse Practitioners) who all work together to provide you with the care you need, when you need it.  Your next appointment:  2 month(s)  Provider:  Charlies Arthur, PA-C ( CONTACT  CASSIE HALL/ ANGELINE HAMMER FOR EP SCHEDULING ISSUES )     We recommend signing up for the patient portal called MyChart.  Sign up information is provided on this After Visit Summary.  MyChart is used to connect with patients for Virtual Visits (Telemedicine).  Patients are able to view lab/test results, encounter notes, upcoming appointments, etc.  Non-urgent messages can be sent to your provider as well.   To learn more about what you can do with MyChart, go to [email or web address].   Other Instructions

## 2023-12-09 ENCOUNTER — Ambulatory Visit: Payer: Self-pay | Admitting: Cardiology

## 2024-01-02 NOTE — Progress Notes (Signed)
 Leslie [name]                                          MRN: [id number]   01/02/2024   The VBCI Quality Team Specialist reviewed this patient medical record for the purposes of chart review for care gap closure. The following were reviewed: chart review for care gap closure-kidney health evaluation for diabetes:uACR.    VBCI Quality Team

## 2024-01-08 NOTE — Progress Notes (Signed)
 Remote ICD Transmission

## 2024-01-19 ENCOUNTER — Ambulatory Visit (INDEPENDENT_AMBULATORY_CARE_PROVIDER_SITE_OTHER)

## 2024-01-19 DIAGNOSIS — I447 Left bundle-branch block, unspecified: Secondary | ICD-10-CM | POA: Diagnosis not present

## 2024-01-23 LAB — CUP PACEART REMOTE DEVICE CHECK
Battery Remaining Longevity: 114 mo
Battery Voltage: 2.99 V
Brady Statistic AP VP Percent: 0.02 %
Brady Statistic AP VS Percent: 0.01 %
Brady Statistic AS VP Percent: 99.92 %
Brady Statistic AS VS Percent: 0.05 %
Brady Statistic RA Percent Paced: 0.03 %
Brady Statistic RV Percent Paced: 99.94 %
Date Time Interrogation Session: 20251016210714
HighPow Impedance: 77 Ohm
Implantable Lead Connection Status: 753985
Implantable Lead Connection Status: 753985
Implantable Lead Connection Status: 753985
Implantable Lead Implant Date: 20250602
Implantable Lead Implant Date: 20250602
Implantable Lead Implant Date: 20250602
Implantable Lead Location: 753858
Implantable Lead Location: 753859
Implantable Lead Location: 753860
Implantable Lead Model: 3830
Implantable Lead Model: 5076
Implantable Lead Model: 6935
Implantable Pulse Generator Implant Date: 20250602
Lead Channel Impedance Value: 190 Ohm
Lead Channel Impedance Value: 361 Ohm
Lead Channel Impedance Value: 361 Ohm
Lead Channel Impedance Value: 437 Ohm
Lead Channel Impedance Value: 532 Ohm
Lead Channel Impedance Value: 532 Ohm
Lead Channel Pacing Threshold Amplitude: 0.375 V
Lead Channel Pacing Threshold Amplitude: 0.625 V
Lead Channel Pacing Threshold Amplitude: 0.625 V
Lead Channel Pacing Threshold Pulse Width: 0.4 ms
Lead Channel Pacing Threshold Pulse Width: 0.4 ms
Lead Channel Pacing Threshold Pulse Width: 0.4 ms
Lead Channel Sensing Intrinsic Amplitude: 1 mV
Lead Channel Sensing Intrinsic Amplitude: 14 mV
Lead Channel Setting Pacing Amplitude: 1 V
Lead Channel Setting Pacing Amplitude: 1.5 V
Lead Channel Setting Pacing Amplitude: 2 V
Lead Channel Setting Pacing Pulse Width: 0.4 ms
Lead Channel Setting Pacing Pulse Width: 0.4 ms
Lead Channel Setting Sensing Sensitivity: 0.3 mV
Zone Setting Status: 755011
Zone Setting Status: 755011
Zone Setting Status: 755011

## 2024-01-25 NOTE — Progress Notes (Signed)
 Remote ICD Transmission

## 2024-01-29 ENCOUNTER — Ambulatory Visit: Payer: Self-pay | Admitting: Cardiology

## 2024-02-06 ENCOUNTER — Telehealth: Payer: Self-pay | Admitting: *Deleted

## 2024-02-06 ENCOUNTER — Ambulatory Visit: Attending: Cardiology | Admitting: Cardiology

## 2024-02-06 ENCOUNTER — Other Ambulatory Visit: Payer: Self-pay | Admitting: Emergency Medicine

## 2024-02-06 VITALS — BP 138/70 | HR 89 | Ht 63.0 in | Wt 197.0 lb

## 2024-02-06 DIAGNOSIS — I10 Essential (primary) hypertension: Secondary | ICD-10-CM | POA: Diagnosis not present

## 2024-02-06 DIAGNOSIS — I2510 Atherosclerotic heart disease of native coronary artery without angina pectoris: Secondary | ICD-10-CM | POA: Insufficient documentation

## 2024-02-06 DIAGNOSIS — I5022 Chronic systolic (congestive) heart failure: Secondary | ICD-10-CM

## 2024-02-06 DIAGNOSIS — I255 Ischemic cardiomyopathy: Secondary | ICD-10-CM

## 2024-02-06 DIAGNOSIS — I214 Non-ST elevation (NSTEMI) myocardial infarction: Secondary | ICD-10-CM

## 2024-02-06 DIAGNOSIS — E119 Type 2 diabetes mellitus without complications: Secondary | ICD-10-CM

## 2024-02-06 DIAGNOSIS — Z95810 Presence of automatic (implantable) cardiac defibrillator: Secondary | ICD-10-CM

## 2024-02-06 DIAGNOSIS — Z955 Presence of coronary angioplasty implant and graft: Secondary | ICD-10-CM

## 2024-02-06 DIAGNOSIS — E669 Obesity, unspecified: Secondary | ICD-10-CM

## 2024-02-06 DIAGNOSIS — E785 Hyperlipidemia, unspecified: Secondary | ICD-10-CM | POA: Diagnosis not present

## 2024-02-06 HISTORY — DX: Chronic systolic (congestive) heart failure: I50.22

## 2024-02-06 MED ORDER — TICAGRELOR 60 MG PO TABS: 60.0000 mg | ORAL_TABLET | Freq: Two times a day (BID) | ORAL | 3 refills | Status: AC

## 2024-02-06 MED ORDER — DAPAGLIFLOZIN PROPANEDIOL 10 MG PO TABS: 10.0000 mg | ORAL_TABLET | Freq: Every day | ORAL | 3 refills | Status: AC

## 2024-02-06 MED ORDER — SACUBITRIL-VALSARTAN 49-51 MG PO TABS: 1.0000 | ORAL_TABLET | Freq: Two times a day (BID) | ORAL | 3 refills | Status: AC

## 2024-02-06 MED ORDER — METOPROLOL SUCCINATE ER 100 MG PO TB24: 100.0000 mg | ORAL_TABLET | Freq: Every day | ORAL | 3 refills | Status: AC

## 2024-02-06 NOTE — Patient Instructions (Addendum)
 Medication Instructions:  STOP ASPIRIN    CHANGE  TO BRILINTA  60 MG  TWICE A DAY    *If you need a refill on your cardiac medications before your next appointment, please call your pharmacy*   Lab Work:  CMP LIPD If you have labs (blood work) drawn today and your tests are completely normal, you will receive your results only by: MyChart Message (if you have MyChart) OR A paper copy in the mail If you have any lab test that is abnormal or we need to change your treatment, we will call you to review the results.   Testing/Procedures:  Your physician has requested that you have an echocardiogram. Echocardiography is a painless test that uses sound waves to create images of your heart. It provides your doctor with information about the size and shape of your heart and how well your heart's chambers and valves are working. This procedure takes approximately one hour. There are no restrictions for this procedure. Please do NOT wear cologne, perfume, aftershave, or lotions (deodorant is allowed). Please arrive 15 minutes prior to your appointment time.  Please note: We ask at that you not bring children with you during ultrasound (echo/ vascular) testing. Due to room size and safety concerns, children are not allowed in the ultrasound rooms during exams. Our front office staff cannot provide observation of children in our lobby area while testing is being conducted. An adult accompanying a patient to their appointment will only be allowed in the ultrasound room at the discretion of the ultrasound technician under special circumstances. We apologize for any inconvenience.   Follow-Up: At [facility], you and your health needs are our priority.  As part of our continuing mission to provide you with exceptional heart care, we have created designated Provider Care Teams.  These Care Teams include your primary Cardiologist (physician) and Advanced Practice Providers (APPs -  Physician Assistants  and Nurse Practitioners) who all work together to provide you with the care you need, when you need it.     Your next appointment:   4 month(s)  The format for your next appointment:   In Person  Provider:   Alm Clay, MD   ( Sent request to medication assistance team )

## 2024-02-06 NOTE — Progress Notes (Signed)
 Cardiology Office Note:  .   Date:  02/10/2024  ID:  Leslie [name], DOB Feb 05, 1956, MRN [id number] PCP: Rosamond Leta NOVAK, MD  [facility] Providers Cardiologist:  Alm Clay, MD Cardiology APP:  Jadine Aline BRAVO, [name]-C  Electrophysiologist:  OLE ONEIDA HOLTS, MD     Chief Complaint  Patient presents with   Follow-up    1-month follow-up to establish new cardiologist   Coronary Artery Disease    No angina   Cardiomyopathy    Recent ICD   Congestive Heart Failure    Not on diuretic    Patient Profile: .     Leslie [name] is a moderately obese 68 y.o. female  with a PMH reviewed below who presents here for 23-month visit to establish new cardiologist.  She returns at the request of Vyas, Dhruv B, MD.  PMH: CAD (DES PCI to LAD and PTCA of D2-1024)\ ICM-chronic HFrEF: GDMT pillars: Farxiga  10 mL daily, Toprol -XL 100 mg daily, Entresto  49/51 mg twice daily.  Spironolactone  not used because of hyperkalemia, not currently on diuretic as she is euvolemic. LBBB CRT-D/BiV ICD (June 2025 by Dr. Holts HTN HLD DM-2  Last visit with Dr. Burnard was just before his retirement on September 11, 2023: Review of symptoms says see HPI for cardiac symptoms, but nothing as discussed.     Leslie [name] was most recently seen by Charlies Arthur, [name] for EP follow-up after ICD placement on 12/05/2023: OptiVol numbers look good.  Battery was working well.  LV lead in the LV position.  Subjective  Discussed the use of AI scribe software for clinical note transcription with the patient, who gave verbal consent to proceed.  History of Present Illness Leslie [name] is a 69 year old female with coronary artery disease and heart failure who presents for follow-up after pacemaker implantation. She is accompanied by her spouse. She was referred by Dr. Burnard for follow-up after stent placement.  In October 2024, she experienced a myocardial infarction, necessitating the placement of a stent in the left anterior  descending artery. An echocardiogram in February showed an ejection fraction of 35%, and a cardiac MRI in May showed an ejection fraction of 33%. She was found to have a left bundle branch block. She had a biventricular pacemaker with defibrillator implanted in June 2025.  Over the past week, she has experienced increased fatigue, although she never fully regained her strength following the stent placement. She denies experiencing chest pain, pressure, or tightness. She uses an albuterol  inhaler at night due to mucus drainage and a dry cough, but does not experience orthopnea or paroxysmal nocturnal dyspnea. She notes swelling in her left foot, particularly after prolonged sitting, but not in her right foot. She sleeps on two pillows and does not wake up with dyspnea.  Her home blood pressure readings typically range from 110-130/60-70 mmHg. Her current medications include aspirin  81 mg daily, Brilinta  90 mg daily, atorvastatin  40 mg, glipizide 5 mg (two in the morning, one in the evening), metformin 500 mg (two in the morning, one in the evening), metoprolol  succinate 100 mg, Entresto  49/51 mg twice a day, Protonix  40 mg at night, and Farxiga  10 mg. She uses an albuterol  inhaler as needed.  She has a history of diabetes, managed with glipizide, metformin, and Farxiga . Her weight remains stable, and she does not experience significant leg swelling, aside from the left foot swelling mentioned earlier.   Cardiovascular ROS: positive for - dyspnea on exertion, edema, irregular heartbeat, shortness  of breath, and extra intolerance, lack of strength.  Generally feels weak.  Only has left leg swelling. negative for - loss of consciousness, orthopnea, paroxysmal nocturnal dyspnea, rapid heart rate, shortness of breath, or lightheadedness, dizziness or wooziness, syncope or near syncope or TIA ramus fugax, claudication.  Melena, hematochezia, hematuria or epistaxis.  ROS:  Review of Systems - Negative except  symptoms noted above    Objective     Studies Reviewed: SABRA       Labs:  Last lipids were from October 2024: TC 123, TG 190, HDL 43, LDL 42; 06/28/2023-A1c 7.1 4/83/7974-rmzjupwpwz 0.85, potassium 5.2   Results RADIOLOGY Cardiac MRI: Moderately increased LV function-EF 33%; prominent septal dyssynergy suggestive of LBBB.  Normal RV function.  (08/2023)  DIAGNOSTIC Echocardiogram: Ejection fraction 35%, globally reduced function, impaired relaxation, normal right ventricle, normal aortic and mitral valves, normal right-sided pressures, EF 35-40% with interventricular dyssynchrony (05/2023) Echocardiogram: LV desynchrony due to LBBB.  EF estimated 35 to 40% with global HK.  Unable to assess filling pressures or right-sided pressures.  Trivial MR.  AoV calcification/sclerosis with no stenosis.  Normal RAP. CATH-PCI: (01/30/2023) Dominance: Right Single-vessel CAD with extensive LAD calcification and a segmental 70-85% stenosis in the mid LAD beginning at and involving ostium of small caliber 2nd Diag. Successful bifurcation PCI of the LAD and 2nd Diag using a Synergy XD DES 2.25 mm x 24 mm deployed to 2.4 mm and postdilated proximally to 2.6 mm-reducing the lesion segment to 0% PTCA of ostial 2nd Diag sidebranch with 1.0 mm balloon to maintain flow Otherwise large caliber codominant LCx that terminates as a large posterolateral OM branch and large caliber RCA that terminates as a posterior descending artery with a small PL branch. Reduced EF by echo, mildly increased LVEDP.  Diagnostic           Intervention   BiV ICD (09/04/2023)    Risk Assessment/Calculations:           Physical Exam:   VS:  BP 138/70   Pulse 89   Ht 5' 3 (1.6 m)   Wt 197 lb (89.4 kg)   SpO2 98%   BMI 34.90 kg/m    Wt Readings from Last 3 Encounters:  02/06/24 197 lb (89.4 kg)  12/05/23 201 lb (91.2 kg)  09/11/23 196 lb 12.8 oz (89.3 kg)    GEN: Well nourished, well groomed; in no acute distress;  moderately obese.  Walks with walker/cane NECK: No JVD; No carotid bruits CARDIAC: Normal S1, S2; RRR, trivial/soft 1/6 SEM but otherwise no murmurs, rubs, gallops RESPIRATORY:  Clear to auscultation without rales, wheezing or rhonchi ; nonlabored, good air movement. ABDOMEN: Soft, non-tender, non-distended EXTREMITIES:  No edema; No deformity      ASSESSMENT AND PLAN: .    Problem List Items Addressed This Visit       Cardiology Problems   CAD S/P DES PCI to LAD - Primary (Chronic)   No current angina symptoms. On dual antiplatelet therapy with Brilinta  and aspirin , but plan to adjust to reduce bleeding risk. - Okay to stop aspirin  and reduce to maintenance dose of Brilinta  (60 mg). - Discussed potential switch to Plavix if Brilinta  becomes too expensive. - Continue atorvastatin  40 mg daily for cholesterol management. - Continue with current dose of Toprol -XL and Entresto        Relevant Medications   metoprolol  succinate (TOPROL -XL) 100 MG 24 hr tablet   sacubitril -valsartan  (ENTRESTO ) 49-51 MG   Chronic HFrEF (heart failure with reduced  ejection fraction) (HCC) (Chronic)   Chronic systolic heart failure with reduced ejection fraction status post biventricular ICD placement and coronary stenting EF remains low at 30-35 %.  Reports increased fatigue and occasional left foot swelling, but no real PND, orthopnea or significant edema.  NYHA class II symptoms Blood pressure slightly elevated today but generally well-controlled at home.  No recent echocardiogram since ICD placement to assess pump function improvement.  - Stopped aspirin  and switched to maintenance dose of Brilinta  (60 mg). - GDMT pillars: Continue Entresto  49-51 mg daily, Toprol  XL 100 mg daily, and Farxiga  10 mg daily.  Not on spironolactone  due to hyperkalemia. - Advised elevating feet when sitting to reduce swelling. => Consider as needed Lasix.  - Will order echocardiogram in December to assess pump function  post-ICD placement.      Relevant Medications   metoprolol  succinate (TOPROL -XL) 100 MG 24 hr tablet   sacubitril -valsartan  (ENTRESTO ) 49-51 MG   Other Relevant Orders   ECHOCARDIOGRAM COMPLETE   Lipid panel (Completed)   Comprehensive metabolic panel with GFR (Completed)   Essential hypertension (Chronic)   BP is higher today than it usually is at home.  She says that at home her pressures usually in the 120s to 130 range over 60s to 70s therefore I am reluctant to push the Entresto  dose further. -She is on Entresto  49-55 mg twice daily along with Toprol -XL 100 mg daily.  Again not on spironolactone  because of hyperkalemia. => If pressures remain elevated and her home checks, would increase to max dose Entresto .      Relevant Medications   metoprolol  succinate (TOPROL -XL) 100 MG 24 hr tablet   sacubitril -valsartan  (ENTRESTO ) 49-51 MG   Hyperlipidemia with target low density lipoprotein (LDL) cholesterol less than 55 mg/dL (Chronic)   Managed with atorvastatin . No recent lipid panel to assess cholesterol levels. - Continue atorvastatin  40 mg daily. - Ordered blood work to assess FLP and CMP.      Relevant Medications   metoprolol  succinate (TOPROL -XL) 100 MG 24 hr tablet   sacubitril -valsartan  (ENTRESTO ) 49-51 MG   Other Relevant Orders   Lipid panel (Completed)   Comprehensive metabolic panel with GFR (Completed)   Ischemic cardiomyopathy (Chronic)   Significant anterior MI with reduced EF.  Unfortunately EF did not improve after revascularization and GDMT titration. Now status post CRT-D placement with plan to continue GDMT and reassess EF by echo      Relevant Medications   metoprolol  succinate (TOPROL -XL) 100 MG 24 hr tablet   sacubitril -valsartan  (ENTRESTO ) 49-51 MG   NSTEMI (non-ST elevated myocardial infarction) (HCC) (Chronic)   Sizable anterior non-STEMI just over 1 year ago.  LAD PCI but still has significant reduced function post MI.  Thankfully, no further angina  and only class II CHF      Relevant Medications   metoprolol  succinate (TOPROL -XL) 100 MG 24 hr tablet   sacubitril -valsartan  (ENTRESTO ) 49-51 MG   Other Relevant Orders   ECHOCARDIOGRAM COMPLETE   Lipid panel (Completed)   Comprehensive metabolic panel with GFR (Completed)     Other   Cardiac resynchronization therapy defibrillator (CRT-D) in place (Chronic)   Close to 6 months out post CRT-D.  She is on GDMT.  Will plan to reassess EF by echo to determine if there is an improvement with CRT      Relevant Orders   Lipid panel (Completed)   Comprehensive metabolic panel with GFR (Completed)   Diabetes mellitus type 2, controlled (HCC) (Chronic)   Managed with  glipizide, metformin, and Farxiga . No recent lab work to assess glycemic control. - Continue glipizide and metformin as prescribed. - Continue Farxiga  for diabetes and heart failure management. - With CAD, CHF and DM as well as obesity, consider GLP-1 agonist. - Ordered blood work to assess glycemic control.      Relevant Medications   dapagliflozin  propanediol (FARXIGA ) 10 MG TABS tablet   Obesity (BMI 30-39.9) (Chronic)   Moderately obese.  With her having diabetes, would consider the benefit of GLP-1 agonist, but defer to PCP.      Relevant Medications   dapagliflozin  propanediol (FARXIGA ) 10 MG TABS tablet   Other Relevant Orders   Lipid panel (Completed)   Comprehensive metabolic panel with GFR (Completed)   Presence of drug coated stent in LAD coronary artery (Chronic)   On dual antiplatelet therapy with Brilinta  and aspirin , but plan to adjust to reduce bleeding risk. - Okay to aspirin  and switched to maintenance dose of Brilinta  (60 mg). - Discussed potential switch to Plavix if Brilinta  becomes too expensive.           Follow-Up: Return in about 4 months (around 06/05/2024) for [employer], Routine follow up with me.  I spent 61 minutes in the care of Leslie [name] today including  reviewing labs (2 minutes), reviewing studies (cardiac cath films, echocardiogram as well as cardiac MRI all reviewed-9 minutes), face to face time discussing treatment options (26 minutes), reviewing records from her hospitalization and catheterization as well as several visits between EP and Dr. Burnard (11 minutes), 13 minutes dictating, updating history, and documenting in the encounter.      Signed, Alm MICAEL Clay, MD, MS Alm Clay, M.D., M.S. Interventional Cardiologist  [facility] Pager # [phone]

## 2024-02-06 NOTE — Telephone Encounter (Signed)
 Patient had  an appointment and Rx were refilled

## 2024-02-06 NOTE — Telephone Encounter (Signed)
 Patient was in for establish appr with Dr Anner  She states she received assistance with this  following med Farxiga  10 mg  Brilinta  60 mg ( changed at this visit)  Entresto  49/51 mg- ( I know this is generic now)    Is her approval for assistance if so  is she eligible for more assistance, if so could you start it

## 2024-02-07 ENCOUNTER — Other Ambulatory Visit (HOSPITAL_COMMUNITY): Payer: Self-pay

## 2024-02-07 NOTE — Telephone Encounter (Signed)
 Patient was approved for a healthwell grant. Billing info and instructions have been provided to pharmacy.

## 2024-02-08 ENCOUNTER — Other Ambulatory Visit (HOSPITAL_COMMUNITY): Payer: Self-pay

## 2024-02-08 ENCOUNTER — Telehealth: Payer: Self-pay

## 2024-02-08 NOTE — Telephone Encounter (Signed)
 Question for Med assistance Team--  What medication does the healthwell grant cover?

## 2024-02-08 NOTE — Telephone Encounter (Addendum)
 Patient Advocate Encounter   The patient was approved for a [employer] grant that will help cover the cost of FARXIGA  AND ENTRESTO  Total amount awarded, $4,500.  Effective: 01/15/24 - 01/13/25   APW:[id number] ERW:[id number] Hmnle:[id number] PI:[id number]   Pharmacy provided with approval and processing information. Pt is aware of grant approval.   Keerstin Bjelland, CPhT  Pharmacy Patient Advocate Specialist  Direct Number: [phone] Fax: [phone]

## 2024-02-08 NOTE — Telephone Encounter (Signed)
 Patient is covered under a cardiomyopathy grant fund so that would apply to Farxiga  and Entresto .   All the drugs covered under this fund can be found at [email or web address]

## 2024-02-08 NOTE — Progress Notes (Deleted)
 Cardiology Office Note:  .   Date:  02/08/2024  ID:  [name], DOB 08-Mar-1956, MRN [id number] PCP: Rosamond Leta NOVAK, MD  [facility] Providers Cardiologist:  Alm Clay, MD Cardiology APP:  Jadine Aline BRAVO, PA-C  Electrophysiologist:  OLE ONEIDA HOLTS, MD {  History of Present Illness: .   Lyllie Cobbins is a 68 y.o. female w/PMHx of  DM, HTN,  CAD (PCI/DES to LAD and balloon angioplasty of the second diagonal Oct 2024), ICM, chronic CHF LBBB CRT-D  I saw her 12/05/23: She has pocket discomfort Feels sometimes is rubbing on her clavicle, or when laying on her R side feels like it is going to fall out While it feels markedly better then immediately post implant remains somewhat tender Site was well healed, no signs of infection, no skin changes Stable measurements Planned to f/u in 2 mo Updated echo timing deferred to Dr. Clay  She saw Dr. Clay 02/06/24 C/o doe, edema, generalized weakness Advised to elevate feet, might consider lasix Echo scheduled for 03/12/24 Brlinta alone, no ASA  Today's visit is scheduled asplanned f/u visit ROS:   *** pocket, site *** symptoms, *** volume *** pacing %  She is doing well Denies any CP, palpitations or cardiac awareness No SOB, denies DOE No near syncope or syncope    Device information MDT  CRT-D implanted 09/04/23 No acceptable CS branches/measurements > LB area pacing lead placed HV lead in RV apex  Primary prevention   Studies Reviewed: SABRA    EKG not done today 09/11/23: SR/VP (QRS )  DEVICE interrogation done today and reviewed by myself *** Battery and lead measurements are good *** No arrhythmias BP ***% *** OptiVol looks good   08/08/23: c.MRI IMPRESSION: 1. Moderate decrease in left ventricular systolic function (LVEF =33%). There is prominent septal dyssynchrony suggestive of left bundle branch block. 2. There is no late gadolinium enhancement in the left ventricular myocardium. 3.  Normal right ventricular systolic function (RVEF =55%).  LHC 10/24   Mid LAD lesion is 80% stenosed with 70% stenosed side branch in 2nd Diag.   Balloon angioplasty was performed on the 2nd Diag ostium, using a BALLOON TAKERU 1.5X12.  Post intervention, the side branch was reduced to 55% residual stenosis.   A drug-eluting stent was successfully placed using a SYNERGY XD 2.25X2 -> deployed to 2.4 mm, postdilated proximally to 2.6 mm. Post intervention, there is a 0% residual stenosis.   --------------------------------------   Otherwise minimal CAD in the remainder of the coronary system.   --------------------------------------   LV end diastolic pressure is mildly elevated.   There is no aortic valve stenosis.   POST-CATH DIAGNOSES Single-vessel CAD with extensive LAD calcification and a segmental 70-85% stenosis in the mid LAD beginning at and involving ostium of small caliber 2nd Diag. Successful bifurcation PCI of the LAD and 2nd Diag using a Synergy XD DES 2.25 mm x 24 mm deployed to 2.4 mm and postdilated proximally to 2.6 mm-reducing the lesion segment to 0% PTCA of ostial 2nd Diag sidebranch with 1.0 mm balloon to maintain flow Otherwise large caliber codominant LCx that terminates as a large posterolateral OM branch and large caliber RCA that terminates as a posterior descending artery with a small PL branch. Reduced EF by echo, mildly increased LVEDP.    Risk Assessment/Calculations:    Physical Exam:   VS:  There were no vitals taken for this visit.   Wt Readings from Last 3 Encounters:  02/06/24 197 lb (  89.4 kg)  12/05/23 201 lb (91.2 kg)  09/11/23 196 lb 12.8 oz (89.3 kg)    GEN: Well nourished, well developed in no acute distress NECK: No JVD; No carotid bruits CARDIAC: *** RRR, no murmurs, rubs, gallops RESPIRATORY: *** CTA b/l without rales, wheezing or rhonchi  ABDOMEN: Soft, non-tender, non-distended EXTREMITIES: *** No edema; No deformity   ICD site: is stable,  no thinning, fluctuation, tethering *** Some tenderness toward lateral edge of the pocket, scar Appears very well healed with no signs of infection  ASSESSMENT AND PLAN: .    CRT-D (LV lead is in LB position) *** intact function *** no programming changes made   CAD ICM Chronic CHF *** No symptoms or exam findings of volume OL *** OptiVol looks good C/w Dr. Huston > defer timing of next echo to him, sees him in Nov   Dispo: ***, sooner if needed.  Remotes as usual  Signed, Charlies Macario Arthur, PA-C

## 2024-02-09 ENCOUNTER — Other Ambulatory Visit: Payer: Self-pay | Admitting: Emergency Medicine

## 2024-02-09 DIAGNOSIS — I2510 Atherosclerotic heart disease of native coronary artery without angina pectoris: Secondary | ICD-10-CM | POA: Diagnosis not present

## 2024-02-09 DIAGNOSIS — I5022 Chronic systolic (congestive) heart failure: Secondary | ICD-10-CM | POA: Diagnosis not present

## 2024-02-09 DIAGNOSIS — Z955 Presence of coronary angioplasty implant and graft: Secondary | ICD-10-CM | POA: Diagnosis not present

## 2024-02-09 DIAGNOSIS — E669 Obesity, unspecified: Secondary | ICD-10-CM | POA: Diagnosis not present

## 2024-02-09 DIAGNOSIS — Z95810 Presence of automatic (implantable) cardiac defibrillator: Secondary | ICD-10-CM | POA: Diagnosis not present

## 2024-02-09 DIAGNOSIS — I214 Non-ST elevation (NSTEMI) myocardial infarction: Secondary | ICD-10-CM | POA: Diagnosis not present

## 2024-02-09 LAB — LIPID PANEL

## 2024-02-10 ENCOUNTER — Encounter: Payer: Self-pay | Admitting: Cardiology

## 2024-02-10 ENCOUNTER — Ambulatory Visit: Payer: Self-pay | Admitting: Cardiology

## 2024-02-10 LAB — LIPID PANEL
Chol/HDL Ratio: 2.6 ratio (ref 0.0–4.4)
Cholesterol, Total: 118 mg/dL (ref 100–199)
HDL: 45 mg/dL (ref >39)
LDL Chol Calc (NIH): 44 mg/dL (ref 0–99)
Triglycerides: 177 mg/dL — ABNORMAL HIGH (ref 0–149)
VLDL Cholesterol Cal: 29 mg/dL (ref 5–40)

## 2024-02-10 LAB — COMPREHENSIVE METABOLIC PANEL WITH GFR
ALT: 20 IU/L (ref 0–32)
AST: 20 IU/L (ref 0–40)
Albumin: 4.4 g/dL (ref 3.9–4.9)
Alkaline Phosphatase: 82 IU/L (ref 49–135)
BUN/Creatinine Ratio: 15 (ref 12–28)
BUN: 13 mg/dL (ref 8–27)
Bilirubin Total: 0.5 mg/dL (ref 0.0–1.2)
CO2: 22 mmol/L (ref 20–29)
Calcium: 9.7 mg/dL (ref 8.7–10.3)
Chloride: 106 mmol/L (ref 96–106)
Creatinine, Ser: 0.85 mg/dL (ref 0.57–1.00)
Globulin, Total: 2.2 g/dL (ref 1.5–4.5)
Glucose: 125 mg/dL — ABNORMAL HIGH (ref 70–99)
Potassium: 5 mmol/L (ref 3.5–5.2)
Sodium: 141 mmol/L (ref 134–144)
Total Protein: 6.6 g/dL (ref 6.0–8.5)
eGFR: 75 mL/min/1.73 (ref >59)

## 2024-02-10 NOTE — Assessment & Plan Note (Addendum)
 No current angina symptoms. On dual antiplatelet therapy with Brilinta  and aspirin , but plan to adjust to reduce bleeding risk. - Okay to stop aspirin  and reduce to maintenance dose of Brilinta  (60 mg). - Discussed potential switch to Plavix if Brilinta  becomes too expensive. - Continue atorvastatin  40 mg daily for cholesterol management. - Continue with current dose of Toprol -XL and Entresto 

## 2024-02-10 NOTE — Assessment & Plan Note (Signed)
 Chronic systolic heart failure with reduced ejection fraction status post biventricular ICD placement and coronary stenting EF remains low at 30-35 %.  Reports increased fatigue and occasional left foot swelling, but no real PND, orthopnea or significant edema.  NYHA class II symptoms Blood pressure slightly elevated today but generally well-controlled at home.  No recent echocardiogram since ICD placement to assess pump function improvement.  - Stopped aspirin  and switched to maintenance dose of Brilinta  (60 mg). - GDMT pillars: Continue Entresto  49-51 mg daily, Toprol  XL 100 mg daily, and Farxiga  10 mg daily.  Not on spironolactone  due to hyperkalemia. - Advised elevating feet when sitting to reduce swelling. => Consider as needed Lasix.  - Will order echocardiogram in December to assess pump function post-ICD placement.

## 2024-02-10 NOTE — Assessment & Plan Note (Signed)
 Moderately obese.  With her having diabetes, would consider the benefit of GLP-1 agonist, but defer to PCP.

## 2024-02-10 NOTE — Assessment & Plan Note (Addendum)
 Sizable anterior non-STEMI just over 1 year ago.  LAD PCI but still has significant reduced function post MI.  Thankfully, no further angina and only class II CHF

## 2024-02-10 NOTE — Assessment & Plan Note (Signed)
 Close to 6 months out post CRT-D.  She is on GDMT.  Will plan to reassess EF by echo to determine if there is an improvement with CRT

## 2024-02-10 NOTE — Assessment & Plan Note (Signed)
 Significant anterior MI with reduced EF.  Unfortunately EF did not improve after revascularization and GDMT titration. Now status post CRT-D placement with plan to continue GDMT and reassess EF by echo

## 2024-02-10 NOTE — Assessment & Plan Note (Signed)
 Managed with atorvastatin . No recent lipid panel to assess cholesterol levels. - Continue atorvastatin  40 mg daily. - Ordered blood work to assess FLP and CMP.

## 2024-02-10 NOTE — Assessment & Plan Note (Signed)
 Managed with glipizide, metformin, and Farxiga . No recent lab work to assess glycemic control. - Continue glipizide and metformin as prescribed. - Continue Farxiga  for diabetes and heart failure management. - With CAD, CHF and DM as well as obesity, consider GLP-1 agonist. - Ordered blood work to assess glycemic control.

## 2024-02-10 NOTE — Assessment & Plan Note (Signed)
 BP is higher today than it usually is at home.  She says that at home her pressures usually in the 120s to 130 range over 60s to 70s therefore I am reluctant to push the Entresto  dose further. -She is on Entresto  49-55 mg twice daily along with Toprol -XL 100 mg daily.  Again not on spironolactone  because of hyperkalemia. => If pressures remain elevated and her home checks, would increase to max dose Entresto .

## 2024-02-10 NOTE — Assessment & Plan Note (Signed)
 On dual antiplatelet therapy with Brilinta  and aspirin , but plan to adjust to reduce bleeding risk. - Okay to aspirin  and switched to maintenance dose of Brilinta  (60 mg). - Discussed potential switch to Plavix if Brilinta  becomes too expensive.

## 2024-02-12 DIAGNOSIS — E7800 Pure hypercholesterolemia, unspecified: Secondary | ICD-10-CM | POA: Diagnosis not present

## 2024-02-12 DIAGNOSIS — R5383 Other fatigue: Secondary | ICD-10-CM | POA: Diagnosis not present

## 2024-02-12 DIAGNOSIS — Z0000 Encounter for general adult medical examination without abnormal findings: Secondary | ICD-10-CM | POA: Diagnosis not present

## 2024-02-12 DIAGNOSIS — Z79899 Other long term (current) drug therapy: Secondary | ICD-10-CM | POA: Diagnosis not present

## 2024-02-12 MED ORDER — ATORVASTATIN CALCIUM 40 MG PO TABS: 40.0000 mg | ORAL_TABLET | Freq: Every day | ORAL | 3 refills | Status: AC

## 2024-02-13 ENCOUNTER — Ambulatory Visit: Admitting: Physician Assistant

## 2024-02-14 ENCOUNTER — Encounter: Payer: Self-pay | Admitting: Cardiology

## 2024-02-14 NOTE — Telephone Encounter (Signed)
 Spoke to patient . She is aware that Farxiga  and Entresto   are available through [employer].   Brilinta  - generic  does not have patient assistance because the medications is generic now. Patient verbalized understanding.

## 2024-02-14 NOTE — Telephone Encounter (Addendum)
 Unfortunately, the Brilinta  assistance program was discontinued this year.  Please see details at [email or web address]

## 2024-02-14 NOTE — Telephone Encounter (Signed)
 Please see encounter details from 02/08/24.

## 2024-02-20 DIAGNOSIS — H18593 Other hereditary corneal dystrophies, bilateral: Secondary | ICD-10-CM | POA: Diagnosis not present

## 2024-02-20 DIAGNOSIS — H2640 Unspecified secondary cataract: Secondary | ICD-10-CM | POA: Diagnosis not present

## 2024-02-20 DIAGNOSIS — H04123 Dry eye syndrome of bilateral lacrimal glands: Secondary | ICD-10-CM | POA: Diagnosis not present

## 2024-03-12 ENCOUNTER — Ambulatory Visit (HOSPITAL_COMMUNITY)

## 2024-03-19 NOTE — Progress Notes (Unsigned)
 Cardiology Office Note:  .   Date:  03/19/2024  ID:  Leslie [name], DOB 1955-08-13, MRN [id number] PCP: Rosamond Leta NOVAK, MD  [facility] Providers Cardiologist:  Alm Clay, MD Cardiology APP:  Jadine Aline BRAVO, PA-C  Electrophysiologist:  OLE ONEIDA HOLTS, MD {  History of Present Illness: .   Leslie [name] is a 68 y.o. female w/PMHx of  DM, HTN,  CAD (PCI/DES to LAD and balloon angioplasty of the second diagonal Oct 2024), ICM, chronic CHF LBBB CRT-D  I saw her 12/05/23 She is doing well Denies any CP, palpitations or cardiac awareness No SOB, denies DOE No near syncope or syncope She has pocket discomfort Feels sometimes is rubbing on her clavicle, or when laying on her R side feels like it is going to fall out While it feels markedly better then immediately post implant remains somewhat tender Planned to see her back in a couple months to ensure pocket stable F/u echo timing deferred to Dr. Clay  She saw Dr. Clay 02/06/24, reported more fatigued, no symptoms otherwise, outside of some edema (L>R), planned for echo, no medication changed  Echo is ordered, not scheduled  Today's visit is scheduled as a planned f/u visit ROS:   She is doing well Winter/cold weather bothers her asthma , but doing good Site/pocket feels much better, still gets a little tenderness, pain at the superior edge of the pocket, at the incision.  It is random, fleeting.  No CP, palpitations, SOB No near syncope or syncope   Device information MDT  CRT-D implanted 09/04/23 No acceptable CS branches/measurements > LB area pacing lead placed  Primary prevention   Studies Reviewed: SABRA    EKG not done today  DEVICE interrogation not done today Normal in office check Sept 2025 and remote Oct 2025   08/08/23: c.MRI IMPRESSION: 1. Moderate decrease in left ventricular systolic function (LVEF =33%). There is prominent septal dyssynchrony suggestive of left bundle branch block. 2.  There is no late gadolinium enhancement in the left ventricular myocardium. 3. Normal right ventricular systolic function (RVEF =55%).  LHC 10/24   Mid LAD lesion is 80% stenosed with 70% stenosed side branch in 2nd Diag.   Balloon angioplasty was performed on the 2nd Diag ostium, using a BALLOON TAKERU 1.5X12.  Post intervention, the side branch was reduced to 55% residual stenosis.   A drug-eluting stent was successfully placed using a SYNERGY XD 2.25X2 -> deployed to 2.4 mm, postdilated proximally to 2.6 mm. Post intervention, there is a 0% residual stenosis.   --------------------------------------   Otherwise minimal CAD in the remainder of the coronary system.   --------------------------------------   LV end diastolic pressure is mildly elevated.   There is no aortic valve stenosis.   POST-CATH DIAGNOSES Single-vessel CAD with extensive LAD calcification and a segmental 70-85% stenosis in the mid LAD beginning at and involving ostium of small caliber 2nd Diag. Successful bifurcation PCI of the LAD and 2nd Diag using a Synergy XD DES 2.25 mm x 24 mm deployed to 2.4 mm and postdilated proximally to 2.6 mm-reducing the lesion segment to 0% PTCA of ostial 2nd Diag sidebranch with 1.0 mm balloon to maintain flow Otherwise large caliber codominant LCx that terminates as a large posterolateral OM branch and large caliber RCA that terminates as a posterior descending artery with a small PL branch. Reduced EF by echo, mildly increased LVEDP.    Risk Assessment/Calculations:    Physical Exam:   VS:  There were no  vitals taken for this visit.   Wt Readings from Last 3 Encounters:  02/06/24 197 lb (89.4 kg)  12/05/23 201 lb (91.2 kg)  09/11/23 196 lb 12.8 oz (89.3 kg)    GEN: Well nourished, well developed in no acute distress NECK: No JVD; No carotid bruits CARDIAC: RRR, no murmurs, rubs, gallops RESPIRATORY: CTA b/l without rales, wheezing or rhonchi  ABDOMEN: Soft, non-tender,  non-distended EXTREMITIES: No edema; No deformity   ICD site: is stable, well healed, no thinning, fluctuation, tethering, no signs of infection   ASSESSMENT AND PLAN: .    CRT-D (LV lead is in LB position) Normal in clinic check Sept, remote Oct   CAD ICM Chronic CHF No symptoms or exam findings of volume OL OptiVol looks good on her remote Updated echo pending  C/w Dr. Huston  She has been assigned Dr. Nancey  Dispo: remotes as usual, in clinic again with EP in a year, sooner if needed, sooner if needed.  Signed, Charlies Macario Arthur, PA-C

## 2024-03-20 ENCOUNTER — Ambulatory Visit: Attending: Physician Assistant | Admitting: Physician Assistant

## 2024-03-20 VITALS — BP 136/64 | HR 86 | Ht 63.0 in | Wt 198.0 lb

## 2024-03-20 DIAGNOSIS — I2510 Atherosclerotic heart disease of native coronary artery without angina pectoris: Secondary | ICD-10-CM

## 2024-03-20 DIAGNOSIS — I255 Ischemic cardiomyopathy: Secondary | ICD-10-CM | POA: Diagnosis not present

## 2024-03-20 DIAGNOSIS — Z95810 Presence of automatic (implantable) cardiac defibrillator: Secondary | ICD-10-CM

## 2024-03-20 NOTE — Patient Instructions (Signed)
 Medication Instructions:   Your physician recommends that you continue on your current medications as directed. Please refer to the Current Medication list given to you today.   *If you need a refill on your cardiac medications before your next appointment, please call your pharmacy*    Lab Work: NONE ORDERED  TODAY    If you have labs (blood work) drawn today and your tests are completely normal, you will receive your results only by: MyChart Message (if you have MyChart) OR A paper copy in the mail If you have any lab test that is abnormal or we need to change your treatment, we will call you to review the results.   Testing/Procedures: NONE ORDERED  TODAY    Follow-Up: At [facility] you and your health needs are our priority.  As part of our continuing mission to provide you with exceptional heart care, our providers are all part of one team.  This team includes your primary Cardiologist (physician) and Advanced Practice Providers or APPs (Physician Assistants and Nurse Practitioners) who all work together to provide you with the care you need, when you need it.  Your next appointment:   1 year(s)  Provider:   Eulas Furbish, MD     We recommend signing up for the patient portal called MyChart.  Sign up information is provided on this After Visit Summary.  MyChart is used to connect with patients for Virtual Visits (Telemedicine).  Patients are able to view lab/test results, encounter notes, upcoming appointments, etc.  Non-urgent messages can be sent to your provider as well.   To learn more about what you can do with MyChart, go to [email or web address].   Other Instructions

## 2024-03-29 ENCOUNTER — Other Ambulatory Visit: Payer: Self-pay | Admitting: Internal Medicine

## 2024-03-29 DIAGNOSIS — Z1231 Encounter for screening mammogram for malignant neoplasm of breast: Secondary | ICD-10-CM

## 2024-04-02 ENCOUNTER — Ambulatory Visit
Admission: RE | Admit: 2024-04-02 | Discharge: 2024-04-02 | Disposition: A | Discharge status: Home / Self Care | Source: Ambulatory Visit

## 2024-04-02 DIAGNOSIS — Z1231 Encounter for screening mammogram for malignant neoplasm of breast: Secondary | ICD-10-CM

## 2024-04-12 ENCOUNTER — Ambulatory Visit (HOSPITAL_COMMUNITY)
Admission: RE | Admit: 2024-04-12 | Discharge: 2024-04-12 | Disposition: A | Source: Ambulatory Visit | Attending: Cardiology | Admitting: Cardiology

## 2024-04-12 DIAGNOSIS — I5022 Chronic systolic (congestive) heart failure: Secondary | ICD-10-CM | POA: Insufficient documentation

## 2024-04-12 DIAGNOSIS — I2510 Atherosclerotic heart disease of native coronary artery without angina pectoris: Secondary | ICD-10-CM | POA: Insufficient documentation

## 2024-04-12 DIAGNOSIS — Z955 Presence of coronary angioplasty implant and graft: Secondary | ICD-10-CM | POA: Diagnosis present

## 2024-04-12 DIAGNOSIS — I214 Non-ST elevation (NSTEMI) myocardial infarction: Secondary | ICD-10-CM | POA: Insufficient documentation

## 2024-04-12 LAB — ECHOCARDIOGRAM COMPLETE
Area-P 1/2: 3.65 cm2
Calc EF: 46.3 %
S' Lateral: 2.9 cm
Single Plane A2C EF: 45.5 %
Single Plane A4C EF: 44.4 %

## 2024-04-12 NOTE — Progress Notes (Signed)
*  PRELIMINARY RESULTS* Echocardiogram 2D Echocardiogram has been performed.  Leslie [name] 04/12/2024, 11:43 AM

## 2024-04-19 ENCOUNTER — Ambulatory Visit

## 2024-04-19 DIAGNOSIS — I447 Left bundle-branch block, unspecified: Secondary | ICD-10-CM | POA: Diagnosis not present

## 2024-04-22 LAB — CUP PACEART REMOTE DEVICE CHECK
Battery Remaining Longevity: 109 mo
Battery Voltage: 2.98 V
Brady Statistic RV Percent Paced: 99.93 %
Date Time Interrogation Session: 20260116014838
HighPow Impedance: 79 Ohm
Implantable Lead Connection Status: 753985
Implantable Lead Connection Status: 753985
Implantable Lead Connection Status: 753985
Implantable Lead Implant Date: 20250602
Implantable Lead Implant Date: 20250602
Implantable Lead Implant Date: 20250602
Implantable Lead Location: 753858
Implantable Lead Location: 753859
Implantable Lead Location: 753860
Implantable Lead Model: 3830
Implantable Lead Model: 5076
Implantable Lead Model: 6935
Implantable Pulse Generator Implant Date: 20250602
Lead Channel Impedance Value: 190 Ohm
Lead Channel Impedance Value: 361 Ohm
Lead Channel Impedance Value: 361 Ohm
Lead Channel Impedance Value: 399 Ohm
Lead Channel Impedance Value: 475 Ohm
Lead Channel Impedance Value: 532 Ohm
Lead Channel Pacing Threshold Amplitude: 0.5 V
Lead Channel Pacing Threshold Amplitude: 0.5 V
Lead Channel Pacing Threshold Amplitude: 0.625 V
Lead Channel Pacing Threshold Pulse Width: 0.4 ms
Lead Channel Pacing Threshold Pulse Width: 0.4 ms
Lead Channel Pacing Threshold Pulse Width: 0.4 ms
Lead Channel Sensing Intrinsic Amplitude: 0.9 mV
Lead Channel Sensing Intrinsic Amplitude: 17.3 mV
Lead Channel Setting Pacing Amplitude: 1 V
Lead Channel Setting Pacing Amplitude: 1.5 V
Lead Channel Setting Pacing Amplitude: 2 V
Lead Channel Setting Pacing Pulse Width: 0.4 ms
Lead Channel Setting Pacing Pulse Width: 0.4 ms
Lead Channel Setting Sensing Sensitivity: 0.3 mV
Zone Setting Status: 755011
Zone Setting Status: 755011
Zone Setting Status: 755011

## 2024-04-25 NOTE — Progress Notes (Signed)
 Remote ICD Transmission

## 2024-04-27 ENCOUNTER — Ambulatory Visit: Payer: Self-pay | Admitting: Cardiovascular Disease

## 2024-06-12 ENCOUNTER — Ambulatory Visit: Admitting: Cardiology

## 2024-07-19 ENCOUNTER — Encounter

## 2024-10-18 ENCOUNTER — Encounter
